# Patient Record
Sex: Female | Born: 1960 | Race: White | Hispanic: No | Marital: Married | Smoking: Current every day smoker
Health system: Southern US, Community
[De-identification: ages and names within clinical notes are randomized; demographics above are authoritative.]

## PROBLEM LIST (undated history)

## (undated) DIAGNOSIS — S069XAA Unspecified intracranial injury with loss of consciousness status unknown, initial encounter: Secondary | ICD-10-CM

## (undated) DIAGNOSIS — E785 Hyperlipidemia, unspecified: Secondary | ICD-10-CM

## (undated) DIAGNOSIS — F04 Amnestic disorder due to known physiological condition: Secondary | ICD-10-CM

## (undated) DIAGNOSIS — I2510 Atherosclerotic heart disease of native coronary artery without angina pectoris: Secondary | ICD-10-CM

## (undated) DIAGNOSIS — I10 Essential (primary) hypertension: Secondary | ICD-10-CM

## (undated) DIAGNOSIS — S069X9A Unspecified intracranial injury with loss of consciousness of unspecified duration, initial encounter: Secondary | ICD-10-CM

## (undated) DIAGNOSIS — F1096 Alcohol use, unspecified with alcohol-induced persisting amnestic disorder: Secondary | ICD-10-CM

## (undated) HISTORY — PX: OTHER SURGICAL HISTORY: SHX169

## (undated) HISTORY — DX: Hyperlipidemia, unspecified: E78.5

---

## 2014-02-01 ENCOUNTER — Encounter: Payer: Self-pay | Admitting: Emergency Medicine

## 2014-02-01 ENCOUNTER — Emergency Department (INDEPENDENT_AMBULATORY_CARE_PROVIDER_SITE_OTHER): Payer: 59

## 2014-02-01 ENCOUNTER — Emergency Department (INDEPENDENT_AMBULATORY_CARE_PROVIDER_SITE_OTHER)
Admission: EM | Admit: 2014-02-01 | Discharge: 2014-02-01 | Disposition: A | Discharge status: Home / Self Care | Payer: 59 | Source: Home / Self Care | Attending: Emergency Medicine | Admitting: Emergency Medicine

## 2014-02-01 DIAGNOSIS — S60221A Contusion of right hand, initial encounter: Secondary | ICD-10-CM

## 2014-02-01 DIAGNOSIS — M7989 Other specified soft tissue disorders: Secondary | ICD-10-CM

## 2014-02-01 DIAGNOSIS — S8002XA Contusion of left knee, initial encounter: Secondary | ICD-10-CM

## 2014-02-01 DIAGNOSIS — S42401A Unspecified fracture of lower end of right humerus, initial encounter for closed fracture: Secondary | ICD-10-CM

## 2014-02-01 DIAGNOSIS — M25569 Pain in unspecified knee: Secondary | ICD-10-CM

## 2014-02-01 DIAGNOSIS — S42409A Unspecified fracture of lower end of unspecified humerus, initial encounter for closed fracture: Secondary | ICD-10-CM

## 2014-02-01 DIAGNOSIS — S60229A Contusion of unspecified hand, initial encounter: Secondary | ICD-10-CM

## 2014-02-01 DIAGNOSIS — S52123A Displaced fracture of head of unspecified radius, initial encounter for closed fracture: Secondary | ICD-10-CM

## 2014-02-01 DIAGNOSIS — W19XXXA Unspecified fall, initial encounter: Secondary | ICD-10-CM

## 2014-02-01 DIAGNOSIS — M79609 Pain in unspecified limb: Secondary | ICD-10-CM

## 2014-02-01 DIAGNOSIS — S8000XA Contusion of unspecified knee, initial encounter: Secondary | ICD-10-CM

## 2014-02-01 NOTE — ED Notes (Signed)
Rt arm wrist to elbow injury, Left knee from fall x 4 days ago

## 2014-02-01 NOTE — ED Provider Notes (Signed)
CSN: [id number]     Arrival date & time 02/01/14  [id number] History   First MD Initiated Contact with Patient 02/01/14 1005     Chief Complaint  Patient presents with  . Fall    Patient is a 53 y.o. female presenting with fall. The history is provided by the patient.  Fall This is a new problem. Episode onset: 3 days ago. The problem has not changed since onset.Pertinent negatives include no chest pain, no abdominal pain, no headaches and no shortness of breath. The symptoms are aggravated by bending, twisting and walking. Nothing relieves the symptoms. She has tried nothing for the symptoms.   accidentally fell on pavement 3 days ago. Pain and swelling right lateral hand.-7/10 intensity. Also, pain diffusely right elbow, worsens when flexes elbow. Minimal right shoulder discomfort, but that's improving and denies any problems with range of motion or function of right shoulder. Mild to moderate right anterior knee pain. Sustained an abrasion on kneecap complains of pain underneath the abrasion. She is able to weight-bear, but weightbearing causes pain. No radiation of pain. Denies head injury or spinal complaints. No loss of consciousness or neurologic symptoms  History reviewed. No pertinent past medical history. History reviewed. No pertinent past surgical history. No family history on file. History  Substance Use Topics  . Smoking status: Current Every Day Smoker -- 40 years    Types: Cigarettes  . Smokeless tobacco: Not on file  . Alcohol Use: Yes   OB History   Grav Para Term Preterm Abortions TAB SAB Ect Mult Living                 Review of Systems  Constitutional: Negative for fever and chills.  HENT: Negative.   Respiratory: Negative.  Negative for shortness of breath.   Cardiovascular: Negative for chest pain.  Gastrointestinal: Negative.  Negative for abdominal pain.  Musculoskeletal: Negative for back pain and neck pain.  Neurological: Negative for dizziness, seizures,  syncope, numbness and headaches.      Allergies  Asa Note: she can take other nsaids without side effects  Home Medications   Current Outpatient Rx  Name  Route  Sig  Dispense  Refill  . acetaminophen (TYLENOL) 650 MG CR tablet   Oral   Take 2 tablets (1,300 mg total) by mouth every 8 (eight) hours as needed for pain.   90 tablet   3    BP 113/67  Pulse 91  Temp(Src) 98 F (36.7 C) (Oral)  Ht 5\' 2"  (1.575 m)  Wt 136 lb (61.689 kg)  BMI 24.87 kg/m2  SpO2 96% Physical Exam  Nursing note and vitals reviewed. Constitutional: She is oriented to person, place, and time. She appears well-developed and well-nourished. No distress.  HENT:  Head: Normocephalic and atraumatic.  Eyes: Conjunctivae and EOM are normal. Pupils are equal, round, and reactive to light. No scleral icterus.  Neck: Normal range of motion.  Cardiovascular: Normal rate.   Pulmonary/Chest: Effort normal.  Abdominal: She exhibits no distension.  Musculoskeletal:       Right shoulder: Normal. She exhibits normal range of motion, no tenderness, no bony tenderness, no swelling and no deformity.       Right elbow: She exhibits normal range of motion, no deformity and no laceration. Tenderness (Diffusely, exacerbated by flexion and extension) found. Radial head tenderness noted. No medial epicondyle tenderness noted.       Right wrist: Normal. She exhibits normal range of motion, no tenderness, no bony tenderness,  no swelling and no deformity.       Right upper arm: Normal.       Right forearm: Normal.       Right hand: She exhibits decreased range of motion, bony tenderness and swelling. She exhibits normal capillary refill and no laceration. Normal sensation noted. Normal strength noted.       Hands: Pain and swelling diffusely right hand, especially fifth metacarpal area.  No tenderness or deformity of distal radius or ulna  Neurological: She is alert and oriented to person, place, and time.  Skin: Skin is  warm.  Psychiatric: She has a normal mood and affect.    ED Course  Procedures  Labs Review   Imaging Review Dg Elbow Complete Right 02/01/2014   CLINICAL DATA:  Status post fall 01/29/2014.  Left elbow pain.  EXAM: RIGHT ELBOW - COMPLETE 3+ VIEW  COMPARISON:  None.  FINDINGS: There is a minimally impacted fracture of the anterior aspect of the radial head with an associated elbow joint effusion. No other acute abnormality is identified.  IMPRESSION: Minimally impacted fracture right radial head.   Electronically Signed   By: Drusilla Kanner M.D.   On: 02/01/2014 11:02   Dg Knee Complete 4 Views Left 02/01/2014   CLINICAL DATA:  Status post fall 01/29/2014.  Left knee pain.  EXAM: LEFT KNEE - COMPLETE 4+ VIEW  COMPARISON:  None.  FINDINGS: Imaged bones, joints and soft tissues appear normal.  IMPRESSION: Negative exam.   Electronically Signed   By: Drusilla Kanner M.D.   On: 02/01/2014 11:04   Dg Hand Complete Right 02/01/2014   CLINICAL DATA:  Larey Seat on 01/29/2014, pain and swelling at fifth metacarpal region  EXAM: RIGHT HAND - COMPLETE 3+ VIEW  COMPARISON:  None  FINDINGS: Overpenetrated exam.  Joint spaces preserved.  Question osseous demineralization versus over penetration.  Mild dorsal soft tissue swelling particularly at the level of the metacarpal heads.  No acute fracture, dislocation or bone destruction.  IMPRESSION: No definite acute osseous findings.   Electronically Signed   By: Ulyses Southward M.D.   On: 02/01/2014 10:59      MDM   Final diagnoses:  Closed fracture of right elbow  Contusion of right hand  Contusion of left knee    Reviewed x-rays of right elbow, left knee, and right hand. There is a minimally impacted fracture of the radial head of right elbow. No fracture or dislocation or acute bony abnormality seen in x-rays left knee and right hand.  Treatment options discussed, as well as risks, benefits, alternatives. Patient voiced understanding and agreement with the  following plans: For the right elbow fracture, ace and sling applied. Bulky ace wrap/splint applied right hand in neutral position. Left knee elastic sleeve applied. She tolerated all the above well and that decreased her pain somewhat. Although I offered, she declined any prescription pain medication. She prefers to take 2 Alleve BID PC Other symptomatic care discussed. Rest affected areas, keep elevated. We made follow-up appointment with Dr. Benjamin Stain for tomorrow. Precautions discussed. Red flags discussed. Questions invited and answered. Patient voiced understanding and agreement.    Lajean Manes, MD 02/02/14 [phone]

## 2014-02-02 ENCOUNTER — Ambulatory Visit (INDEPENDENT_AMBULATORY_CARE_PROVIDER_SITE_OTHER): Payer: 59

## 2014-02-02 ENCOUNTER — Encounter: Payer: Self-pay | Admitting: Sports Medicine

## 2014-02-02 ENCOUNTER — Ambulatory Visit (INDEPENDENT_AMBULATORY_CARE_PROVIDER_SITE_OTHER): Payer: 59 | Admitting: Sports Medicine

## 2014-02-02 VITALS — BP 120/66 | HR 105 | Ht 62.0 in | Wt 136.0 lb

## 2014-02-02 DIAGNOSIS — S62319A Displaced fracture of base of unspecified metacarpal bone, initial encounter for closed fracture: Secondary | ICD-10-CM

## 2014-02-02 DIAGNOSIS — S52123A Displaced fracture of head of unspecified radius, initial encounter for closed fracture: Secondary | ICD-10-CM

## 2014-02-02 DIAGNOSIS — M79641 Pain in right hand: Secondary | ICD-10-CM

## 2014-02-02 DIAGNOSIS — W19XXXA Unspecified fall, initial encounter: Secondary | ICD-10-CM

## 2014-02-02 DIAGNOSIS — L988 Other specified disorders of the skin and subcutaneous tissue: Secondary | ICD-10-CM

## 2014-02-02 DIAGNOSIS — M79609 Pain in unspecified limb: Secondary | ICD-10-CM

## 2014-02-02 DIAGNOSIS — IMO0002 Reserved for concepts with insufficient information to code with codable children: Secondary | ICD-10-CM | POA: Insufficient documentation

## 2014-02-02 DIAGNOSIS — S52121A Displaced fracture of head of right radius, initial encounter for closed fracture: Secondary | ICD-10-CM | POA: Insufficient documentation

## 2014-02-02 DIAGNOSIS — S62346A Nondisplaced fracture of base of fifth metacarpal bone, right hand, initial encounter for closed fracture: Secondary | ICD-10-CM | POA: Insufficient documentation

## 2014-02-02 MED ORDER — ACETAMINOPHEN ER 650 MG PO TBCR: 1300.0000 mg | EXTENDED_RELEASE_TABLET | Freq: Three times a day (TID) | ORAL | Status: DC | PRN

## 2014-02-02 NOTE — Assessment & Plan Note (Signed)
This is very mild, nondisplaced and simply impacted. Sling immobilization will be sufficient and full healing will occur approximately for 6 weeks from now.  I billed a fracture code for this visit, all subsequent visits for this complaint will be "post-op checks" in the global period.

## 2014-02-02 NOTE — Assessment & Plan Note (Signed)
There feels to be ganglion cysts in close approximation with the extensor and flexor tendons on the right fourth finger. It was swollen after her fall so we did have to do a ring removal procedure.

## 2014-02-02 NOTE — Progress Notes (Signed)
   Subjective:    I'm seeing this patient as a consultation for:  Dr. Georgina PillionMassey  CC: Right elbow pain, right hand pain  HPI: This is a very pleasant 53 year old female, yesterday she fell, impacting her right elbow and right hand. She was seen in urgent care where x-rays showed a fracture of the radial head and she was referred to me for further evaluation and definitive treatment. She was placed in a sling. Overall pain continues to improve, she does have significant pain she localizes over the base of the fifth metacarpal bone.  Past medical history, Surgical history, Family history not pertinant except as noted below, Social history, Allergies, and medications have been entered into the medical record, reviewed, and no changes needed.   Review of Systems: No headache, visual changes, nausea, vomiting, diarrhea, constipation, dizziness, abdominal pain, skin rash, fevers, chills, night sweats, weight loss, swollen lymph nodes, body aches, joint swelling, muscle aches, chest pain, shortness of breath, mood changes, visual or auditory hallucinations.   Objective:   General: Well Developed, well nourished, and in no acute distress.  Neuro/Psych: Alert and oriented x3, extra-ocular muscles intact, able to move all 4 extremities, sensation grossly intact. Skin: Warm and dry, no rashes noted.  Respiratory: Not using accessory muscles, speaking in full sentences, trachea midline.  Cardiovascular: Pulses palpable, no extremity edema. Abdomen: Does not appear distended. Right Elbow: Unremarkable to inspection. Range of motion full pronation, supination, flexion, extension. Strength is full to all of the above directions Stable to varus, valgus stress. Negative moving valgus stress test. Tender to palpation over the radial head Ulnar nerve does not sublux. Negative cubital tunnel Tinel's. Right hand: Tenderness to palpation at the base of the fifth metacarpal bone.  X-rays were reviewed, there is  a depressed fracture of the radial head, hand x-rays were personally reviewed, they do also show a lucency through the base of the fifth metacarpal bone.  CT scan of the hand was obtained confirming fracture through the base of the fifth metacarpal.  Impression and Recommendations:   This case required medical decision making of moderate complexity.

## 2014-02-02 NOTE — Assessment & Plan Note (Addendum)
Pain is localized along the base and the proximal shaft of the fifth metacarpal. X-ray does show evidence of a lucency that may represent a fracture through the radial base of the fifth metacarpal. I am going to obtain a CT scan of her hand for further delineation.  CT scan does confirm fracture, I will have her come back in for a Velcro brace.  I billed a fracture code for this visit, all subsequent visits for this complaint will be "post-op checks" in the global period.

## 2014-02-05 ENCOUNTER — Encounter: Payer: Self-pay | Admitting: Family Medicine

## 2014-02-05 ENCOUNTER — Ambulatory Visit (INDEPENDENT_AMBULATORY_CARE_PROVIDER_SITE_OTHER): Payer: 59 | Admitting: Family Medicine

## 2014-02-05 VITALS — BP 153/78 | HR 78 | Ht 62.0 in | Wt 137.0 lb

## 2014-02-05 DIAGNOSIS — Z299 Encounter for prophylactic measures, unspecified: Secondary | ICD-10-CM

## 2014-02-05 DIAGNOSIS — Z1239 Encounter for other screening for malignant neoplasm of breast: Secondary | ICD-10-CM

## 2014-02-05 DIAGNOSIS — R030 Elevated blood-pressure reading, without diagnosis of hypertension: Secondary | ICD-10-CM

## 2014-02-05 DIAGNOSIS — R232 Flushing: Secondary | ICD-10-CM

## 2014-02-05 DIAGNOSIS — IMO0001 Reserved for inherently not codable concepts without codable children: Secondary | ICD-10-CM

## 2014-02-05 DIAGNOSIS — E785 Hyperlipidemia, unspecified: Secondary | ICD-10-CM | POA: Insufficient documentation

## 2014-02-05 DIAGNOSIS — H547 Unspecified visual loss: Secondary | ICD-10-CM | POA: Insufficient documentation

## 2014-02-05 LAB — LIPID PANEL
CHOL/HDL RATIO: 4.4 ratio
Cholesterol: 268 mg/dL — ABNORMAL HIGH (ref 0–200)
HDL: 61 mg/dL (ref >39)
LDL CALC: 172 mg/dL — AB (ref 0–99)
Triglycerides: 175 mg/dL — ABNORMAL HIGH (ref <150)
VLDL: 35 mg/dL (ref 0–40)

## 2014-02-05 LAB — BASIC METABOLIC PANEL WITH GFR
BUN: 6 mg/dL (ref 6–23)
CALCIUM: 8.9 mg/dL (ref 8.4–10.5)
CO2: 26 mEq/L (ref 19–32)
Chloride: 103 mEq/L (ref 96–112)
Creat: 0.52 mg/dL (ref 0.50–1.10)
Glucose, Bld: 89 mg/dL (ref 70–99)
POTASSIUM: 4.3 meq/L (ref 3.5–5.3)
SODIUM: 137 meq/L (ref 135–145)

## 2014-02-05 LAB — TSH: TSH: 2.558 u[IU]/mL (ref 0.350–4.500)

## 2014-02-05 NOTE — Patient Instructions (Addendum)
Dr. Lajoyce Lauber General Advice Following Your Complete Physical Exam  The Benefits of Regular Exercise: Unless you suffer from an uncontrolled cardiovascular condition, studies strongly suggest that regular exercise and physical activity will add to both the quality and length of your life.  The World Health Organization recommends 150 minutes of moderate intensity aerobic activity every week.  This is best split over 3-4 days a week, and can be as simple as a brisk walk for just over 35 minutes "most days of the week".  This type of exercise has been shown to lower LDL-Cholesterol, lower average blood sugars, lower blood pressure, lower cardiovascular disease risk, improve memory, and increase one's overall sense of wellbeing.  The addition of anaerobic (or "strength training") exercises offers additional benefits including but not limited to increased metabolism, prevention of osteoporosis, and improved overall cholesterol levels.  How Can I Strive For A Low-Fat Diet?: Current guidelines recommend that 25-35 percent of your daily energy (food) intake should come from fats.  One might ask how can this be achieved without having to dissect each meal on a daily basis?  Switch to skim or 1% milk instead of whole milk.  Focus on lean meats such as ground [place], fresh fish, baked chicken, and lean cuts of beef as your source of dietary protein.  Limit saturated fat consumption to less than 10% of your daily caloric intake.  Limit trans fatty acid consumption primarily by limiting synthetic trans fats such as partially hydrogenated oils (Ex: fried fast foods).  Substitute olive or vegetable oil for solid fats where possible.  Moderation of Salt Intake: Provided you don't carry a diagnosis of congestive heart failure nor renal failure, I recommend a daily allowance of no more than 2300 mg of salt (sodium).  Keeping under this daily goal is associated with a decreased risk of cardiovascular events, creeping  above it can lead to elevated blood pressures and increases your risk of cardiovascular events.  Milligrams (mg) of salt is listed on all nutrition labels, and your daily intake can add up faster than you think.  Most canned and frozen dinners can pack in over half your daily salt allowance in one meal.    Lifestyle Health Risks: Certain lifestyle choices carry specific health risks.  As you may already know, tobacco use has been associated with increasing one's risk of cardiovascular disease, pulmonary disease, numerous cancers, among many other issues.  What you may not know is that there are medications and nicotine replacement strategies that can more than double your chances of successfully quitting.  I would be thrilled to help manage your quitting strategy if you currently use tobacco products.  When it comes to alcohol use, I've yet to find an "ideal" daily allowance.  Provided an individual does not have a medical condition that is exacerbated by alcohol consumption, general guidelines determine "safe drinking" as no more than two standard drinks for a man or no more than one standard drink for a female per day.  However, much debate still exists on whether any amount of alcohol consumption is technically "safe".  My general advice, keep alcohol consumption to a minimum for general health promotion.  If you or others believe that alcohol, tobacco, or recreational drug use is interfering with your life, I would be happy to provide confidential counseling regarding treatment options.  General "Over The Counter" Nutrition Advice: Postmenopausal women should aim for a daily calcium intake of 1200 mg, however a significant portion of this might already be  provided by diets including milk, yogurt, cheese, and other dairy products.  Vitamin D has been shown to help preserve bone density, prevent fatigue, and has even been shown to help reduce falls in the elderly.  Ensuring a daily intake of 800 Units of  Vitamin D is a good place to start to enjoy the above benefits, we can easily check your Vitamin D level to see if you'd potentially benefit from supplementation beyond 800 Units a day.  Folic Acid intake should be of particular concern to women of childbearing age.  Daily consumption of 400-800 mcg of Folic Acid is recommended to minimize the chance of spinal cord defects in a fetus should pregnancy occur.    For many adults, accidents still remain one of the most common culprits when it comes to cause of death.  Some of the simplest but most effective preventitive habits you can adopt include regular seatbelt use, proper helmet use, securing firearms, and regularly testing your smoke and carbon monoxide detectors.  Mishayla Sliwinski B. [facility] DO [facility] [address], [address] [place], [place] [address] Phone: [phone]   DASH Diet The DASH diet stands for "Dietary Approaches to Stop Hypertension." It is a healthy eating plan that has been shown to reduce high blood pressure (hypertension) in as little as 14 days, while also possibly providing other significant health benefits. These other health benefits include reducing the risk of breast cancer after menopause and reducing the risk of type 2 diabetes, heart disease, colon cancer, and stroke. Health benefits also include weight loss and slowing kidney failure in patients with chronic kidney disease.  DIET GUIDELINES  Limit salt (sodium). Your diet should contain less than 1500 mg of sodium daily.  Limit refined or processed carbohydrates. Your diet should include mostly whole grains. Desserts and added sugars should be used sparingly.  Include small amounts of heart-healthy fats. These types of fats include nuts, oils, and tub margarine. Limit saturated and trans fats. These fats have been shown to be harmful in the body. CHOOSING FOODS  The following food groups are based on a 2000 calorie diet. See your Registered Dietitian for  individual calorie needs. Grains and Grain Products (6 to 8 servings daily)  Eat More Often: Whole-wheat bread, brown rice, whole-grain or wheat pasta, quinoa, popcorn without added fat or salt (air popped).  Eat Less Often: White bread, white pasta, white rice, cornbread. Vegetables (4 to 5 servings daily)  Eat More Often: Fresh, frozen, and canned vegetables. Vegetables may be raw, steamed, roasted, or grilled with a minimal amount of fat.  Eat Less Often/Avoid: Creamed or fried vegetables. Vegetables in a cheese sauce. Fruit (4 to 5 servings daily)  Eat More Often: All fresh, canned (in natural juice), or frozen fruits. Dried fruits without added sugar. One hundred percent fruit juice ( cup [237 mL] daily).  Eat Less Often: Dried fruits with added sugar. Canned fruit in light or heavy syrup. Foot LockerLean Meats, Fish, and Poultry (2 servings or less daily. One serving is 3 to 4 oz [85-114 g]).  Eat More Often: Ninety percent or leaner ground beef, tenderloin, sirloin. Round cuts of beef, chicken breast, [place] breast. All fish. Grill, bake, or broil your meat. Nothing should be fried.  Eat Less Often/Avoid: Fatty cuts of meat, [place], or chicken leg, thigh, or wing. Fried cuts of meat or fish. Dairy (2 to 3 servings)  Eat More Often: Low-fat or fat-free milk, low-fat plain or light yogurt, reduced-fat or part-skim cheese.  Eat Less Often/Avoid: Milk (whole, 2%).Whole milk yogurt. Full-fat cheeses. Nuts, Seeds, and Legumes (4 to 5 servings per week)  Eat More Often: All without added salt.  Eat Less Often/Avoid: Salted nuts and seeds, canned beans with added salt. Fats and Sweets (limited)  Eat More Often: Vegetable oils, tub margarines without trans fats, sugar-free gelatin. Mayonnaise and salad dressings.  Eat Less Often/Avoid: Coconut oils, palm oils, butter, stick margarine, cream, half and half, cookies, candy, pie. FOR MORE INFORMATION The Dash Diet Eating Plan:  www.dashdiet.org Document Released: 11/19/2011 Document Revised: 02/22/2012 Document Reviewed: 11/19/2011 [facility] Patient Information 2014 [place], [place].

## 2014-02-05 NOTE — Progress Notes (Signed)
CC: Kristi Tanner is a 53 y.o. female is here for Establish Care   Subjective: HPI:   Pleasant 53 year old here to establish care no primary care services in 5 years  Reports a history of hyperlipidemia last cholesterol panel over 5 years ago she's unsure of specifics has never been on cholesterol lowering medications she intermittently takes fish oils and omega red.    She reports no history of elevated blood pressure reports that it has never been above 140/90 to her knowledge other than today. She does report she is in quite a bit of pain in her right hand that was worsened over the weekend but has improved since being fitted with a mobilizing brace earlier this morning.  She complains today of vision loss has been present for matter of months to years but has not been any better or worse. Is described as bilateral mild to moderate in severity. It does not change throughout the day. She describes it is uniform nothing particularly makes it worse it is improved when looking at objects within arm's length when wearing over-the-counter reading glasses. She has a strong family history of type 2 diabetes.  She also complains of what she describes as sweats and hot flashes that occurs occasionally throughout the week usually in periods of subjective anxiety this is been present ever since 2012 which was the time of her last period. She has had no vaginal bleeding since 2012. Symptoms overall are mild and improving over the past 2 years. She wants to know, former she has to deal with this  Review Of Systems Outlined In HPI  Past Medical History  Diagnosis Date  . Hyperlipidemia     Past Surgical History  Procedure Laterality Date  . (2)c-sections     Family History  Problem Relation Age of Onset  . Hyperlipidemia Mother   . Hypertension Mother   . Diabetes Father     History   Social History  . Marital Status: Married    Spouse Name: N/A    Number of Children: N/A  . Years of  Education: N/A   Occupational History  . Not on file.   Social History Main Topics  . Smoking status: Current Every Day Smoker -- 40 years    Types: Cigarettes  . Smokeless tobacco: Not on file  . Alcohol Use: Yes  . Drug Use: Not on file  . Sexual Activity: Yes    Partners: Male   Other Topics Concern  . Not on file   Social History Narrative  . No narrative on file     Objective: BP 153/78  Pulse 78  Ht 5\' 2"  (1.575 m)  Wt 137 lb (62.143 kg)  BMI 25.05 kg/m2  General: Alert and Oriented, No Acute Distress HEENT: Pupils equal, round, reactive to light. Conjunctivae clear.  Moist mucous membranes pharynx unremarkable Lungs: Clear to auscultation bilaterally, no wheezing/ronchi/rales.  Comfortable work of breathing. Good air movement. Cardiac: Regular rate and rhythm. Normal S1/S2.  No murmurs, rubs, nor gallops.  No carotid bruits Abdomen:  Soft nontender Extremities: No peripheral edema.  Strong peripheral pulses.  Mental Status: No depression, anxiety, nor agitation. Skin: Warm and dry.  Assessment & Plan: [name] was seen today for establish care.  Diagnoses and associated orders for this visit:  Hyperlipidemia - Lipid panel  Elevated blood pressure - BASIC METABOLIC PANEL WITH GFR - TSH  Preventive measure  Vision loss  Screening for breast cancer - MM DIGITAL SCREENING BILATERAL; Future  Vasomotor  flushing     hyperlipidemia: Overdue for annual fasting cholesterol panel Elevated blood pressure: Discussed the DASH diet will check renal function and thyroid function Vision loss: I strongly encouraged her to establish with a local ophthalmologist of her choice we will screen for diabetes today with fasting blood sugar Vasomotor flushing: We discussed that this may persist for up to 5 years after going through menopause there are Medications to help with this however symptoms bother her quality of life to the point where she was to start medications  yet She's overdue for routine annual mammogram     Return in about 3 months (around 05/05/2014).

## 2014-02-06 ENCOUNTER — Telehealth: Payer: Self-pay | Admitting: Family Medicine

## 2014-02-06 DIAGNOSIS — E785 Hyperlipidemia, unspecified: Secondary | ICD-10-CM

## 2014-02-06 NOTE — Telephone Encounter (Signed)
Pt.notified

## 2014-02-06 NOTE — Telephone Encounter (Signed)
Sue LushAndrea, Will you please let Mrs. Kristi Tanner know that her cholesterol was quite elevated however her estimated risk of a heart attack or stroke in the next ten years is 6.8%.  This is below the threshold immediately needing to start a cholesterol lowering medication but does warrant aggressive lifestyle interventions.  I'll send her more info on diet recommendations beyond the DASH diet by mail.  Fasting blood sugar was perfect, no sign of diabetes causing vision loss.  I'd encourage f/u in 3 months.

## 2014-02-23 ENCOUNTER — Ambulatory Visit (INDEPENDENT_AMBULATORY_CARE_PROVIDER_SITE_OTHER): Payer: 59 | Admitting: Sports Medicine

## 2014-02-23 ENCOUNTER — Encounter: Payer: Self-pay | Admitting: Sports Medicine

## 2014-02-23 VITALS — BP 140/72 | HR 94 | Ht 62.0 in | Wt 137.0 lb

## 2014-02-23 DIAGNOSIS — L988 Other specified disorders of the skin and subcutaneous tissue: Secondary | ICD-10-CM

## 2014-02-23 DIAGNOSIS — S62346A Nondisplaced fracture of base of fifth metacarpal bone, right hand, initial encounter for closed fracture: Secondary | ICD-10-CM

## 2014-02-23 DIAGNOSIS — IMO0002 Reserved for concepts with insufficient information to code with codable children: Secondary | ICD-10-CM

## 2014-02-23 DIAGNOSIS — S52121A Displaced fracture of head of right radius, initial encounter for closed fracture: Secondary | ICD-10-CM

## 2014-02-23 DIAGNOSIS — IMO0001 Reserved for inherently not codable concepts without codable children: Secondary | ICD-10-CM

## 2014-02-23 NOTE — Progress Notes (Signed)
  Subjective: 3 weeks postfracture the right radial head and right fifth metacarpal, doing extremely well. Radial head is nontender.   Objective: General: Well-developed, well-nourished, and in no acute distress. Right Elbow: Unremarkable to inspection. Range of motion full pronation, supination, flexion, extension. Strength is full to all of the above directions Stable to varus, valgus stress. Negative moving valgus stress test. No discrete areas of tenderness to palpation. Ulnar nerve does not sublux. Negative cubital tunnel Tinel's. Right hand: Tender to palpation over the fracture at the base of the fifth metacarpal.  Assessment/plan:

## 2014-02-23 NOTE — Assessment & Plan Note (Signed)
Essentially healed clinically, discontinue sling. Return as needed for this.

## 2014-02-23 NOTE — Assessment & Plan Note (Signed)
Still mildly tender to palpation over the fracture site, continue wrist brace, return in 3 weeks.

## 2014-02-23 NOTE — Assessment & Plan Note (Signed)
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

## 2014-03-19 ENCOUNTER — Encounter: Payer: Self-pay | Admitting: Sports Medicine

## 2014-03-19 ENCOUNTER — Ambulatory Visit (INDEPENDENT_AMBULATORY_CARE_PROVIDER_SITE_OTHER): Payer: 59 | Admitting: Sports Medicine

## 2014-03-19 VITALS — BP 143/77 | HR 97 | Ht 62.0 in | Wt 138.0 lb

## 2014-03-19 DIAGNOSIS — S52121A Displaced fracture of head of right radius, initial encounter for closed fracture: Secondary | ICD-10-CM

## 2014-03-19 DIAGNOSIS — M19049 Primary osteoarthritis, unspecified hand: Secondary | ICD-10-CM

## 2014-03-19 DIAGNOSIS — M6740 Ganglion, unspecified site: Secondary | ICD-10-CM

## 2014-03-19 DIAGNOSIS — IMO0002 Reserved for concepts with insufficient information to code with codable children: Secondary | ICD-10-CM

## 2014-03-19 DIAGNOSIS — S62346A Nondisplaced fracture of base of fifth metacarpal bone, right hand, initial encounter for closed fracture: Secondary | ICD-10-CM

## 2014-03-19 MED ORDER — MELOXICAM 15 MG PO TABS: ORAL_TABLET | ORAL | Status: DC

## 2014-03-19 NOTE — Assessment & Plan Note (Signed)
Pain-free now at 6 weeks.

## 2014-03-19 NOTE — Progress Notes (Signed)
  Subjective:    CC: Followup  HPI: Radial head fracture: Resolved.  Base of the fifth metacarpal fracture, right : resolved.  Ganglion cyst: We did discuss injection and aspiration, she has 2 ganglion cysts, one on the dorsum and one on the volar aspect of the left third finger.  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  Past medical history, Surgical history, Family history not pertinant except as noted below, Social history, Allergies, and medications have been entered into the medical record, reviewed, and no changes needed.   Review of Systems: No fevers, chills, night sweats, weight loss, chest pain, or shortness of breath.   Objective:    General: Well Developed, well nourished, and in no acute distress.  Neuro: Alert and oriented x3, extra-ocular muscles intact, sensation grossly intact.  HEENT: Normocephalic, atraumatic, pupils equal round reactive to light, neck supple, no masses, no lymphadenopathy, thyroid nonpalpable.  Skin: Warm and dry, no rashes. Cardiac: Regular rate and rhythm, no murmurs rubs or gallops, no lower extremity edema.  Respiratory: Clear to auscultation bilaterally. Not using accessory muscles, speaking in full sentences.  Procedure: Real-time Ultrasound Guided Injection of left third dorsal ganglion cyst Device: GE Logiq E  Verbal informed consent obtained.  Time-out conducted.  Noted no overlying erythema, induration, or other signs of local infection.  Skin prepped in a sterile fashion.  Local anesthesia: Topical Ethyl chloride.  With sterile technique and under real time ultrasound guidance:  25-gauge needle advanced into cyst, 0.5 cc Kenalog 40, 0.5 cc lidocaine injected easily. Completed without difficulty  Pain immediately resolved suggesting accurate placement of the medication.  Advised to call if fevers/chills, erythema, induration, drainage, or persistent bleeding.  Images  permanently stored and available for review in the ultrasound unit.  Impression: Technically successful ultrasound guided injection.  Procedure: Real-time Ultrasound Guided Injection of left third volar ganglion cyst Device: GE Logiq E  Verbal informed consent obtained.  Time-out conducted.  Noted no overlying erythema, induration, or other signs of local infection.  Skin prepped in a sterile fashion.  Local anesthesia: Topical Ethyl chloride.  With sterile technique and under real time ultrasound guidance:  25-gauge needle advanced into cyst, 0.5 cc Kenalog 40, 0.5 cc lidocaine injected easily. Completed without difficulty  Pain immediately resolved suggesting accurate placement of the medication.  Advised to call if fevers/chills, erythema, induration, drainage, or persistent bleeding.  Images permanently stored and available for review in the ultrasound unit.  Impression: Technically successful ultrasound guided injection.  Procedure: Real-time Ultrasound Guided Injection of right fourth distal interphalangeal joint Device: GE Logiq E  Verbal informed consent obtained.  Time-out conducted.  Noted no overlying erythema, induration, or other signs of local infection.  Skin prepped in a sterile fashion.  Local anesthesia: Topical Ethyl chloride.  With sterile technique and under real time ultrasound guidance:  25-gauge needle advanced and joint, 0.5 cc Kenalog 40, 0.5 cc lidocaine injected easily.  Completed without difficulty  Pain immediately resolved suggesting accurate placement of the medication.  Advised to call if fevers/chills, erythema, induration, drainage, or persistent bleeding.  Images permanently stored and available for review in the ultrasound unit.  Impression: Technically successful ultrasound guided injection.  Impression and Recommendations:

## 2014-03-19 NOTE — Assessment & Plan Note (Addendum)
With painful Heberden and Bouchard nodes. Injection into right fourth distal interphalangeal joint as above. There is also an element of mallet finger, Staxx splint placed.

## 2014-03-19 NOTE — Assessment & Plan Note (Addendum)
Injection into 2 left-sided ganglion cysts as above.

## 2014-03-19 NOTE — Assessment & Plan Note (Signed)
6 weeks out, clinically resolved.

## 2014-04-18 ENCOUNTER — Ambulatory Visit (INDEPENDENT_AMBULATORY_CARE_PROVIDER_SITE_OTHER): Payer: 59 | Admitting: Family Medicine

## 2014-04-18 ENCOUNTER — Encounter: Payer: Self-pay | Admitting: Family Medicine

## 2014-04-18 ENCOUNTER — Ambulatory Visit (INDEPENDENT_AMBULATORY_CARE_PROVIDER_SITE_OTHER): Payer: 59 | Admitting: Sports Medicine

## 2014-04-18 ENCOUNTER — Encounter: Payer: Self-pay | Admitting: Sports Medicine

## 2014-04-18 VITALS — BP 146/72 | HR 101 | Ht 62.0 in | Wt 135.0 lb

## 2014-04-18 DIAGNOSIS — L988 Other specified disorders of the skin and subcutaneous tissue: Secondary | ICD-10-CM

## 2014-04-18 DIAGNOSIS — R030 Elevated blood-pressure reading, without diagnosis of hypertension: Secondary | ICD-10-CM

## 2014-04-18 DIAGNOSIS — E785 Hyperlipidemia, unspecified: Secondary | ICD-10-CM

## 2014-04-18 DIAGNOSIS — IMO0001 Reserved for inherently not codable concepts without codable children: Secondary | ICD-10-CM

## 2014-04-18 DIAGNOSIS — I10 Essential (primary) hypertension: Secondary | ICD-10-CM

## 2014-04-18 DIAGNOSIS — M19049 Primary osteoarthritis, unspecified hand: Secondary | ICD-10-CM

## 2014-04-18 DIAGNOSIS — IMO0002 Reserved for concepts with insufficient information to code with codable children: Secondary | ICD-10-CM

## 2014-04-18 LAB — LIPID PANEL
CHOLESTEROL: 290 mg/dL — AB (ref 0–200)
HDL: 75 mg/dL (ref >39)
LDL Cholesterol: 186 mg/dL — ABNORMAL HIGH (ref 0–99)
TRIGLYCERIDES: 143 mg/dL (ref <150)
Total CHOL/HDL Ratio: 3.9 Ratio
VLDL: 29 mg/dL (ref 0–40)

## 2014-04-18 MED ORDER — HYDROCHLOROTHIAZIDE 25 MG PO TABS: ORAL_TABLET | ORAL | Status: DC

## 2014-04-18 NOTE — Progress Notes (Signed)
CC: Kristi Tanner is a 53 y.o. female is here for Hypertension and Hyperlipidemia   Subjective: HPI:  Followup elevated blood pressure: She has been trying to stick to the DASH diet, reducing sodium intake but admits to difficulty achieving less than 1500 mg daily. No outside blood pressures to report. Exercising most days of the week on a treadmill up to 10 minutes but no longer.  Denies chest pain, shortness of breath, orthopnea, peripheral edema, irregular heartbeat  Followup hyperlipidemia: Trying to stick to a low-fat diet.  Denies limb claudication, motor or sensory disturbances, exertional chest pain.   Review Of Systems Outlined In HPI  Past Medical History  Diagnosis Date  . Hyperlipidemia     Past Surgical History  Procedure Laterality Date  . (2)c-sections     Family History  Problem Relation Age of Onset  . Hyperlipidemia Mother   . Hypertension Mother   . Diabetes Father     History   Social History  . Marital Status: Married    Spouse Name: N/A    Number of Children: N/A  . Years of Education: N/A   Occupational History  . Not on file.   Social History Main Topics  . Smoking status: Current Every Day Smoker -- 40 years    Types: Cigarettes  . Smokeless tobacco: Not on file  . Alcohol Use: Yes  . Drug Use: Not on file  . Sexual Activity: Yes    Partners: Male   Other Topics Concern  . Not on file   Social History Narrative  . No narrative on file     Objective: BP 146/72  Pulse 101  Ht 5\' 2"  (1.575 m)  Wt 135 lb (61.236 kg)  BMI 24.69 kg/m2  General: Alert and Oriented, No Acute Distress HEENT: Pupils equal, round, reactive to light. Conjunctivae clear.   Moist mucous membranes pharynx unremarkable  Lungs: Clear to auscultation bilaterally, no wheezing/ronchi/rales.  Comfortable work of breathing. Good air movement. Cardiac: Regular rate and rhythm. Normal S1/S2.  No murmurs, rubs, nor gallops.   Extremities: No peripheral edema.  Strong  peripheral pulses.  Mental Status: No depression, anxiety, nor agitation. Skin: Warm and dry.  Assessment & Plan: Kristi Tanner was seen today for hypertension and hyperlipidemia.  Diagnoses and associated orders for this visit:  Hyperlipidemia - Lipid panel  Elevated blood pressure - hydrochlorothiazide (HYDRODIURIL) 25 MG tablet; One tablet by mouth every morning for blood pressure control.    Hyperlipidemia: Recheck and cholesterol panel today Elevated blood pressure: Given a formal diagnosis of essential hypertension today therefore start hydrochlorothiazide continue efforts to keep sodium down, encouraged to engage in moderate physical activity up to 30 minutes most days of the week  Return in about 3 months (around 07/19/2014).

## 2014-04-18 NOTE — Progress Notes (Signed)
  Subjective:    CC: Followup  HPI: Left hand ganglion cysts : both are nearly resolved, much softer, and no longer painful after injection.  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 Has now been in extension of mobilization for 4 weeks. Overall much better with less extension lag.  Past medical history, Surgical history, Family history not pertinant except as noted below, Social history, Allergies, and medications have been entered into the medical record, reviewed, and no changes needed.   Review of Systems: No fevers, chills, night sweats, weight loss, chest pain, or shortness of breath.   Objective:    General: Well Developed, well nourished, and in no acute distress.  Neuro: Alert and oriented x3, extra-ocular muscles intact, sensation grossly intact.  HEENT: Normocephalic, atraumatic, pupils equal round reactive to light, neck supple, no masses, no lymphadenopathy, thyroid nonpalpable.  Skin: Warm and dry, no rashes. Cardiac: Regular rate and rhythm, no murmurs rubs or gallops, no lower extremity edema.  Respiratory: Clear to auscultation bilaterally. Not using accessory muscles, speaking in full sentences. Left hand: Ganglion cysts are very soft, the volar ganglion is still present and palpable, dorsal ganglion is all but resolved. Right hand: Approximately 2-3 of extension lag still present with good strength to extension.  Impression and Recommendations:

## 2014-04-18 NOTE — Assessment & Plan Note (Signed)
Right fourth distal interphalangeal joint injection helped some of her pain. Continue extension splinting for additional month, she still has a mild extension lag, maybe 2-3. Return in one month.

## 2014-04-18 NOTE — Assessment & Plan Note (Signed)
Response to ganglion cyst injections, the dorsal ganglion cyst has all but disappeared. The volar ganglion cyst is significantly smaller and much softer, at this point they do not bother her and she does not desire further interventional treatment. At the next visit, we certainly could consider repeat interventional treatment to the volar ganglion.

## 2014-04-19 ENCOUNTER — Telehealth: Payer: Self-pay | Admitting: Family Medicine

## 2014-04-19 DIAGNOSIS — E785 Hyperlipidemia, unspecified: Secondary | ICD-10-CM

## 2014-04-19 MED ORDER — ATORVASTATIN CALCIUM 20 MG PO TABS: 20.0000 mg | ORAL_TABLET | Freq: Every day | ORAL | Status: DC

## 2014-04-19 NOTE — Telephone Encounter (Signed)
Called and no answer and no vm  

## 2014-04-19 NOTE — Telephone Encounter (Signed)
Kristi Tanner, Will you please let patient know that her LDL cholesterol is still significantly elevated and actually increased compared to three months ago.  I'd recommend she start a cholesterol lowering medication known as atorvastatin which I've sen tto her [facility], we can recheck its effectiveness in about three months.

## 2014-04-20 NOTE — Telephone Encounter (Signed)
Pt notified and labs and vitals mailed to pt per her request

## 2014-05-16 ENCOUNTER — Ambulatory Visit (INDEPENDENT_AMBULATORY_CARE_PROVIDER_SITE_OTHER): Payer: 59 | Admitting: Sports Medicine

## 2014-05-16 ENCOUNTER — Encounter: Payer: Self-pay | Admitting: Sports Medicine

## 2014-05-16 VITALS — BP 123/73 | HR 96 | Ht 62.0 in | Wt 136.0 lb

## 2014-05-16 DIAGNOSIS — M19049 Primary osteoarthritis, unspecified hand: Secondary | ICD-10-CM

## 2014-05-16 DIAGNOSIS — L988 Other specified disorders of the skin and subcutaneous tissue: Secondary | ICD-10-CM

## 2014-05-16 DIAGNOSIS — IMO0002 Reserved for concepts with insufficient information to code with codable children: Secondary | ICD-10-CM

## 2014-05-16 NOTE — Assessment & Plan Note (Signed)
Doing well after fourth DIP right sided injection. She does have some Heberden and Bouchard nodes as expected. Return as needed.

## 2014-05-16 NOTE — Progress Notes (Signed)
  Subjective:    CC: Follow up  HPI: Right fourth distal interphalangeal joint osteoarthritis : improved and nearly resolved after injection, and taking meloxicam.  Ganglion cysts: smaller, asymptomatic, and much softer after injection.  Past medical history, Surgical history, Family history not pertinant except as noted below, Social history, Allergies, and medications have been entered into the medical record, reviewed, and no changes needed.   Review of Systems: No fevers, chills, night sweats, weight loss, chest pain, or shortness of breath.   Objective:    General: Well Developed, well nourished, and in no acute distress.  Neuro: Alert and oriented x3, extra-ocular muscles intact, sensation grossly intact.  HEENT: Normocephalic, atraumatic, pupils equal round reactive to light, neck supple, no masses, no lymphadenopathy, thyroid nonpalpable.  Skin: Warm and dry, no rashes. Cardiac: Regular rate and rhythm, no murmurs rubs or gallops, no lower extremity edema.  Respiratory: Clear to auscultation bilaterally. Not using accessory muscles, speaking in full sentences.  Impression and Recommendations:

## 2014-05-16 NOTE — Assessment & Plan Note (Signed)
Improved significant after injections. She's happy with results so far.

## 2014-06-09 ENCOUNTER — Encounter: Payer: Self-pay | Admitting: Emergency Medicine

## 2014-06-09 ENCOUNTER — Telehealth: Payer: Self-pay | Admitting: Emergency Medicine

## 2014-06-09 ENCOUNTER — Emergency Department (INDEPENDENT_AMBULATORY_CARE_PROVIDER_SITE_OTHER): Payer: 59

## 2014-06-09 ENCOUNTER — Emergency Department
Admission: EM | Admit: 2014-06-09 | Discharge: 2014-06-09 | Disposition: A | Discharge status: Home / Self Care | Payer: 59 | Source: Home / Self Care

## 2014-06-09 DIAGNOSIS — R0989 Other specified symptoms and signs involving the circulatory and respiratory systems: Secondary | ICD-10-CM

## 2014-06-09 DIAGNOSIS — R062 Wheezing: Secondary | ICD-10-CM

## 2014-06-09 DIAGNOSIS — R05 Cough: Secondary | ICD-10-CM

## 2014-06-09 DIAGNOSIS — R059 Cough, unspecified: Secondary | ICD-10-CM

## 2014-06-09 DIAGNOSIS — F17200 Nicotine dependence, unspecified, uncomplicated: Secondary | ICD-10-CM

## 2014-06-09 DIAGNOSIS — J40 Bronchitis, not specified as acute or chronic: Secondary | ICD-10-CM

## 2014-06-09 DIAGNOSIS — J189 Pneumonia, unspecified organism: Secondary | ICD-10-CM

## 2014-06-09 DIAGNOSIS — J069 Acute upper respiratory infection, unspecified: Secondary | ICD-10-CM

## 2014-06-09 DIAGNOSIS — R0609 Other forms of dyspnea: Secondary | ICD-10-CM

## 2014-06-09 HISTORY — DX: Essential (primary) hypertension: I10

## 2014-06-09 MED ORDER — METHYLPREDNISOLONE SODIUM SUCC 125 MG IJ SOLR
125.0000 mg | Freq: Once | INTRAMUSCULAR | Status: AC
  Administered 2014-06-09: 125 mg via INTRAMUSCULAR

## 2014-06-09 MED ORDER — DOXYCYCLINE HYCLATE 100 MG PO CAPS: 100.0000 mg | ORAL_CAPSULE | Freq: Two times a day (BID) | ORAL | Status: AC

## 2014-06-09 MED ORDER — BENZONATATE 100 MG PO CAPS: 100.0000 mg | ORAL_CAPSULE | Freq: Three times a day (TID) | ORAL | Status: DC | PRN

## 2014-06-09 MED ORDER — PREDNISONE 50 MG PO TABS: ORAL_TABLET | ORAL | Status: DC

## 2014-06-09 NOTE — ED Notes (Signed)
Cough began on Tuesday worsening,  non productive.  Yesterday when eating solid food, some became stuck in throat and pt. States she couldn't breathed.  Liqs go down fine but has not tried solid food since.

## 2014-06-09 NOTE — ED Provider Notes (Signed)
CSN: [id number]     Arrival date & time 06/09/14  1641 History   None    Chief Complaint  Patient presents with  . Cough    HPI  URI Symptoms Onset: 4-5 days  Description: cough, dyspnea, wheezing Modifying factors:  25+ pack year smoking history   Symptoms Nasal discharge: minimal  Fever: no Sore throat: no Cough: yes Wheezing: yes Ear pain: no GI symptoms: no Sick contacts: no  Red Flags  Stiff neck: no Dyspnea: no Rash: no Swallowing difficulty: no  Sinusitis Risk Factors Headache/face pain: no Double sickening: no tooth pain: no  Allergy Risk Factors Sneezing: no Itchy scratchy throat: no Seasonal symptoms: no  Flu Risk Factors Headache: no muscle aches: no severe fatigue: no   Past Medical History  Diagnosis Date  . Hyperlipidemia   . Hypertension    Past Surgical History  Procedure Laterality Date  . (2)c-sections     Family History  Problem Relation Age of Onset  . Hyperlipidemia Mother   . Hypertension Mother   . Diabetes Father    History  Substance Use Topics  . Smoking status: Current Every Day Smoker -- 0.50 packs/day for 40 years    Types: Cigarettes  . Smokeless tobacco: Never Used  . Alcohol Use: Yes     Comment: 1 glass of wine   OB History   Grav Para Term Preterm Abortions TAB SAB Ect Mult Living                 Review of Systems  All other systems reviewed and are negative.   Allergies  Asa  Home Medications   Prior to Admission medications   Medication Sig Start Date End Date Taking? Authorizing Provider  acetaminophen (TYLENOL) 650 MG CR tablet Take 2 tablets (1,300 mg total) by mouth every 8 (eight) hours as needed for pain. 02/02/14   Monica Bectonhomas J Thekkekandam, MD  atorvastatin (LIPITOR) 20 MG tablet Take 1 tablet (20 mg total) by mouth daily. 04/19/14   Laren BoomSean Hommel, DO  Biotin 5000 MCG CAPS Take 5,000 mcg by mouth 2 (two) times daily.    Historical Provider, MD  Coenzyme Q10 (CO Q-10) 50 MG CAPS Take by mouth.     Historical Provider, MD  hydrochlorothiazide (HYDRODIURIL) 25 MG tablet One tablet by mouth every morning for blood pressure control. 04/18/14 04/18/15  Laren BoomSean Hommel, DO  Krill Oil 300 MG CAPS Take 300 mg by mouth.    Historical Provider, MD  meloxicam (MOBIC) 15 MG tablet One tab PO qAM with breakfast for 2 weeks, then daily prn pain. 03/19/14   Monica Bectonhomas J Thekkekandam, MD  Multiple Vitamin (MULTIVITAMIN) LIQD Take 5 mLs by mouth daily.    Historical Provider, MD  Multiple Vitamins-Calcium (ONE-A-DAY WOMENS FORMULA PO) Take by mouth.    Historical Provider, MD  ranitidine (ZANTAC) 150 MG tablet Take 150 mg by mouth 2 (two) times daily.    Historical Provider, MD   BP 104/70  Pulse 94  Temp(Src) 98.2 F (36.8 C) (Oral)  Ht 5\' 2"  (1.575 m)  Wt 135 lb (61.236 kg)  BMI 24.69 kg/m2  SpO2 97% Physical Exam  Constitutional: She appears well-developed and well-nourished.  HENT:  Head: Normocephalic and atraumatic.  Right Ear: External ear normal.  Left Ear: External ear normal.  +nasal erythema, rhinorrhea bilaterally, + post oropharyngeal erythema    Eyes: Conjunctivae are normal. Pupils are equal, round, and reactive to light.  Neck: Normal range of motion. Neck supple.  Cardiovascular: Normal rate and regular rhythm.   Pulmonary/Chest: Effort normal. She has wheezes.  Abdominal: Soft.  Musculoskeletal: Normal range of motion.  Neurological: She is alert.  Skin: Skin is warm.    ED Course  Procedures (including critical care time) Labs Review Labs Reviewed - No data to display  Imaging Review Dg Chest 2 View  06/09/2014   CLINICAL DATA:  Cough for 5 days.  Smoking history.  EXAM: CHEST  2 VIEW  COMPARISON:  None.  FINDINGS: The cardiomediastinal silhouette is within normal limits. The lungs are well inflated. Mildly increased opacity is questioned in the right infrahilar lung base on the PA image, although no definite abnormality is identified on the lateral radiograph. The left lung is  clear. No pleural effusion or pneumothorax is identified. No acute osseous abnormality is identified.  IMPRESSION: Possible medial right basilar lung infectious infiltrate.   Electronically Signed   By: Sebastian AcheAllen  Grady   On: 06/09/2014 17:43     MDM   1. URI (upper respiratory infection)   2. Bronchitis   3. Wheezing    Noted RLL PNA  with ? Undiagnosed reactive airway disease.  Solumedrol 125mg  IM x1 Prednisone and doxycycline Discussed infectious and resp red flags  Follow up as needed.    The patient and/or caregiver has been counseled thoroughly with regard to treatment plan and/or medications prescribed including dosage, schedule, interactions, rationale for use, and possible side effects and they verbalize understanding. Diagnoses and expected course of recovery discussed and will return if not improved as expected or if the condition worsens. Patient and/or caregiver verbalized understanding.         Doree AlbeeSteven Lilibeth Opie, MD 06/09/14 [phone]

## 2014-06-11 ENCOUNTER — Telehealth: Payer: Self-pay

## 2014-06-11 NOTE — ED Notes (Signed)
I called and spoke with patient and she is not doing better. I advised to call back if anything changes or if she has questions or concerns. I advised patient to schedule a follow up with Dr Ivan AnchorsHommel, her PCP, to confirm resolved.

## 2014-06-19 ENCOUNTER — Ambulatory Visit (INDEPENDENT_AMBULATORY_CARE_PROVIDER_SITE_OTHER): Payer: 59 | Admitting: Family Medicine

## 2014-06-19 ENCOUNTER — Encounter: Payer: Self-pay | Admitting: Family Medicine

## 2014-06-19 VITALS — BP 127/67 | HR 99 | Temp 98.1°F | Wt 134.0 lb

## 2014-06-19 DIAGNOSIS — J189 Pneumonia, unspecified organism: Secondary | ICD-10-CM

## 2014-06-19 DIAGNOSIS — R635 Abnormal weight gain: Secondary | ICD-10-CM

## 2014-06-19 DIAGNOSIS — E785 Hyperlipidemia, unspecified: Secondary | ICD-10-CM

## 2014-06-19 LAB — LIPID PANEL
CHOL/HDL RATIO: 3.8 ratio
Cholesterol: 218 mg/dL — ABNORMAL HIGH (ref 0–200)
HDL: 57 mg/dL (ref >39)
LDL CALC: 130 mg/dL — AB (ref 0–99)
TRIGLYCERIDES: 155 mg/dL — AB (ref <150)
VLDL: 31 mg/dL (ref 0–40)

## 2014-06-19 MED ORDER — ALBUTEROL SULFATE HFA 108 (90 BASE) MCG/ACT IN AERS: INHALATION_SPRAY | RESPIRATORY_TRACT | Status: AC

## 2014-06-19 MED ORDER — TOPIRAMATE 50 MG PO TABS: 50.0000 mg | ORAL_TABLET | Freq: Every day | ORAL | Status: DC

## 2014-06-19 NOTE — Progress Notes (Signed)
CC: Kristi Tanner is a 53 y.o. female is here for f/u UC   Subjective: HPI:  Followup community acquired pneumonia: She has finished a seven-day course of doxycycline and five-day course of prednisone. She states the shortness of breath has resolved, fatigue is improving, wheezing has improved however she still has wheezing in the morning and at nighttime.  She reports she started to get her strength back and appetite has fully returned. She denies any cough or shortness of breath or chest pain.    Followup hyperlipidemia: Continues to take atorvastatin on a daily basis without known side effects, myalgias, right upper quadrant pain. She is also cutting out fat in her diet. No formal exercise routine tries to walk as much as she can during her free time.  Expresses frustration with difficulty losing weight.  She is hopeful that she can stop taking some of her medication such as hydrochlorothiazide or atorvastatin if she can lose some weight. Has already been focusing on portion control.   Review Of Systems Outlined In HPI  Past Medical History  Diagnosis Date  . Hyperlipidemia   . Hypertension     Past Surgical History  Procedure Laterality Date  . (2)c-sections     Family History  Problem Relation Age of Onset  . Hyperlipidemia Mother   . Hypertension Mother   . Diabetes Father     History   Social History  . Marital Status: Married    Spouse Name: N/A    Number of Children: N/A  . Years of Education: N/A   Occupational History  . Not on file.   Social History Main Topics  . Smoking status: Current Every Day Smoker -- 0.50 packs/day for 40 years    Types: Cigarettes  . Smokeless tobacco: Never Used  . Alcohol Use: Yes     Comment: 1 glass of wine  . Drug Use: No  . Sexual Activity: Yes    Partners: Male   Other Topics Concern  . Not on file   Social History Narrative  . No narrative on file     Objective: BP 127/67  Pulse 99  Temp(Src) 98.1 F (36.7 C)  (Oral)  Wt 134 lb (60.782 kg)  General: Alert and Oriented, No Acute Distress HEENT: Pupils equal, round, reactive to light. Conjunctivae clear.  External ears unremarkable, canals clear with intact TMs with appropriate landmarks.  Middle ear appears open without effusion. Pink inferior turbinates.  Moist mucous membranes, pharynx without inflammation nor lesions.  Neck supple without palpable lymphadenopathy nor abnormal masses. Lungs: Trace end expiratory wheezing in the left lung fields, no rhonchi or rails. No signs of consolidation. Comfortable work of breathing Cardiac: Regular rate and rhythm. Normal S1/S2.  No murmurs, rubs, nor gallops.   Abdomen: Mild truncal obesity Extremities: No peripheral edema.  Strong peripheral pulses.  Mental Status: No depression, anxiety, nor agitation. Skin: Warm and dry.  Assessment & Plan: [name] was seen today for f/u uc.  Diagnoses and associated orders for this visit:  CAP (community acquired pneumonia) - albuterol (PROVENTIL HFA;VENTOLIN HFA) 108 (90 BASE) MCG/ACT inhaler; Inhale two puffs every 4-6 hours only as needed for shortness of breath or wheezing.  Hyperlipidemia - Lipid panel  Weight gain - topiramate (TOPAMAX) 50 MG tablet; Take 1 tablet (50 mg total) by mouth daily.    Community acquired pneumonia: Resolved however lingering wheezing therefore start albuterol samples on as-needed basis. Congratulated her on quitting smoking  Hyperlipidemia: Due for cholesterol check after 2  months of a statin Weight gain: Discussed a technically she's not obese based on her BMI, since she hopes to get rid of some of her medications with weight loss we'll see if topiramate provides any benefit.  Return in about 4 weeks (around 07/17/2014).

## 2014-07-15 ENCOUNTER — Other Ambulatory Visit: Payer: Self-pay | Admitting: Family Medicine

## 2014-07-17 ENCOUNTER — Other Ambulatory Visit: Payer: Self-pay | Admitting: Family Medicine

## 2014-07-19 ENCOUNTER — Ambulatory Visit: Payer: 59 | Admitting: Family Medicine

## 2014-07-20 ENCOUNTER — Encounter: Payer: Self-pay | Admitting: Family Medicine

## 2014-07-20 ENCOUNTER — Ambulatory Visit (INDEPENDENT_AMBULATORY_CARE_PROVIDER_SITE_OTHER): Payer: 59 | Admitting: Family Medicine

## 2014-07-20 VITALS — BP 125/70 | HR 111 | Ht 62.0 in | Wt 133.0 lb

## 2014-07-20 DIAGNOSIS — IMO0001 Reserved for inherently not codable concepts without codable children: Secondary | ICD-10-CM

## 2014-07-20 DIAGNOSIS — R030 Elevated blood-pressure reading, without diagnosis of hypertension: Secondary | ICD-10-CM

## 2014-07-20 DIAGNOSIS — E785 Hyperlipidemia, unspecified: Secondary | ICD-10-CM

## 2014-07-20 DIAGNOSIS — R635 Abnormal weight gain: Secondary | ICD-10-CM

## 2014-07-20 MED ORDER — TOPIRAMATE 100 MG PO TABS: 100.0000 mg | ORAL_TABLET | Freq: Every day | ORAL | Status: DC

## 2014-07-20 NOTE — Patient Instructions (Signed)
Stop atorvastatin for two weeks, if muscle discomfort does not resolve then restart atorvastatin and stop hydrochlorothiazide for two weeks.  Let me know if stopping either of them improves your muscle aches.

## 2014-07-20 NOTE — Progress Notes (Signed)
CC: Kristi Tanner is a 53 y.o. female is here for Follow-up   Subjective: HPI:  Followup hypertension: Continues to take hydrochlorothiazide on a daily basis no outside blood pressures to report. Since taking this medication she is noticed muscle discomfort in her upper and lower extremities that she further describes as almost like a mild continuous spasm. Nothing seems to make better or worse but it began soon after she started taking hydrochlorothiazide. Denies chest pain shortness of breath orthopnea nor peripheral edema  Follow hyperlipidemia: Continues to take Lipitor a daily basis without limb claudication or right upper quadrant pain. She notes that she begin taking hydrochlorothiazide and Lipitor at the same time and soon after beginning these medications she began to experience the symptoms described above.  Followup weight gain: Since I saw her last she's not had any unintentional weight gain but expresses frustration with lack of weight loss. She would like to get down to 115 pounds. No formal exercise routine but she does watch what she eats. Drinks 3 glasses of wine a night.  Review Of Systems Outlined In HPI  Past Medical History  Diagnosis Date  . Hyperlipidemia   . Hypertension     Past Surgical History  Procedure Laterality Date  . (2)c-sections     Family History  Problem Relation Age of Onset  . Hyperlipidemia Mother   . Hypertension Mother   . Diabetes Father     History   Social History  . Marital Status: Married    Spouse Name: N/A    Number of Children: N/A  . Years of Education: N/A   Occupational History  . Not on file.   Social History Main Topics  . Smoking status: Current Every Day Smoker -- 0.50 packs/day for 40 years    Types: Cigarettes  . Smokeless tobacco: Never Used  . Alcohol Use: Yes     Comment: 1 glass of wine  . Drug Use: No  . Sexual Activity: Yes    Partners: Male   Other Topics Concern  . Not on file   Social History  Narrative  . No narrative on file     Objective: BP 125/70  Pulse 111  Ht 5\' 2"  (1.575 m)  Wt 133 lb (60.328 kg)  BMI 24.32 kg/m2  General: Alert and Oriented, No Acute Distress HEENT: Pupils equal, round, reactive to light. Conjunctivae clear.  Moist mucous membranes pharynx unremarkable Lungs: Clear to auscultation bilaterally, no wheezing/ronchi/rales.  Comfortable work of breathing. Good air movement. Cardiac: Regular rate and rhythm. Normal S1/S2.  No murmurs, rubs, nor gallops.   Extremities: No peripheral edema.  Strong peripheral pulses.  Mental Status: No depression, anxiety, nor agitation. Skin: Warm and dry.  Assessment & Plan: [name] was seen today for follow-up.  Diagnoses and associated orders for this visit:  Elevated blood pressure  Hyperlipidemia  Weight gain - topiramate (TOPAMAX) 100 MG tablet; Take 1 tablet (100 mg total) by mouth daily.    Elevated blood pressure: Controlled however possible side effect of hydrochlorothiazide therefore we'll stop this for 2 weeks if Lipitor cessation does not relieve her muscle aches. If hydrochlorothiazide is the culprit next up would be to switch to lisinopril Hyperlipidemia: Possible side effect of Lipitor therefore stopped for 2 weeks and continue cessation of muscle aches resolved, if no resolution after 2 weeks restart  weight gain: Advised that cutting back on wine consumption with help with weight loss, she's not a candidate for phentermine for other diet medications due  to her BMI being normal, will increase though   Return in about 2 months (around 09/19/2014).

## 2014-08-01 ENCOUNTER — Other Ambulatory Visit: Payer: Self-pay | Admitting: Sports Medicine

## 2014-10-04 IMAGING — CR DG KNEE COMPLETE 4+V*L*
4 series · 4 of 4 positions shown · non-contrast
Comparison: None.

CLINICAL DATA: Status post fall 01/29/2014.  Left knee pain.

EXAM:
LEFT KNEE - COMPLETE 4+ VIEW

[view not recorded (1 of 4)]
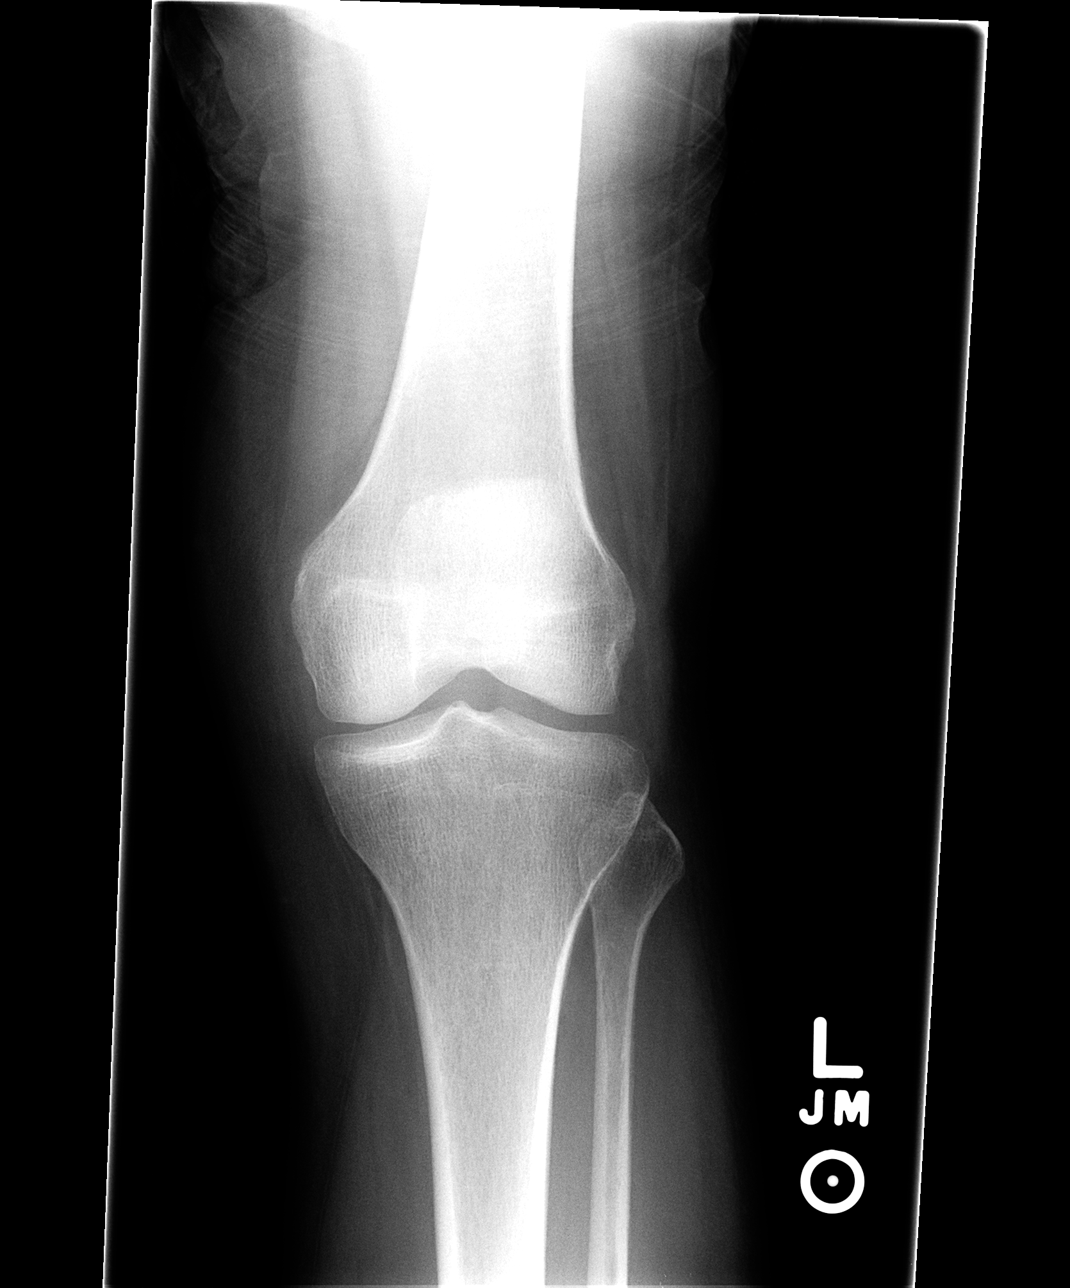

[view not recorded (2 of 4)]
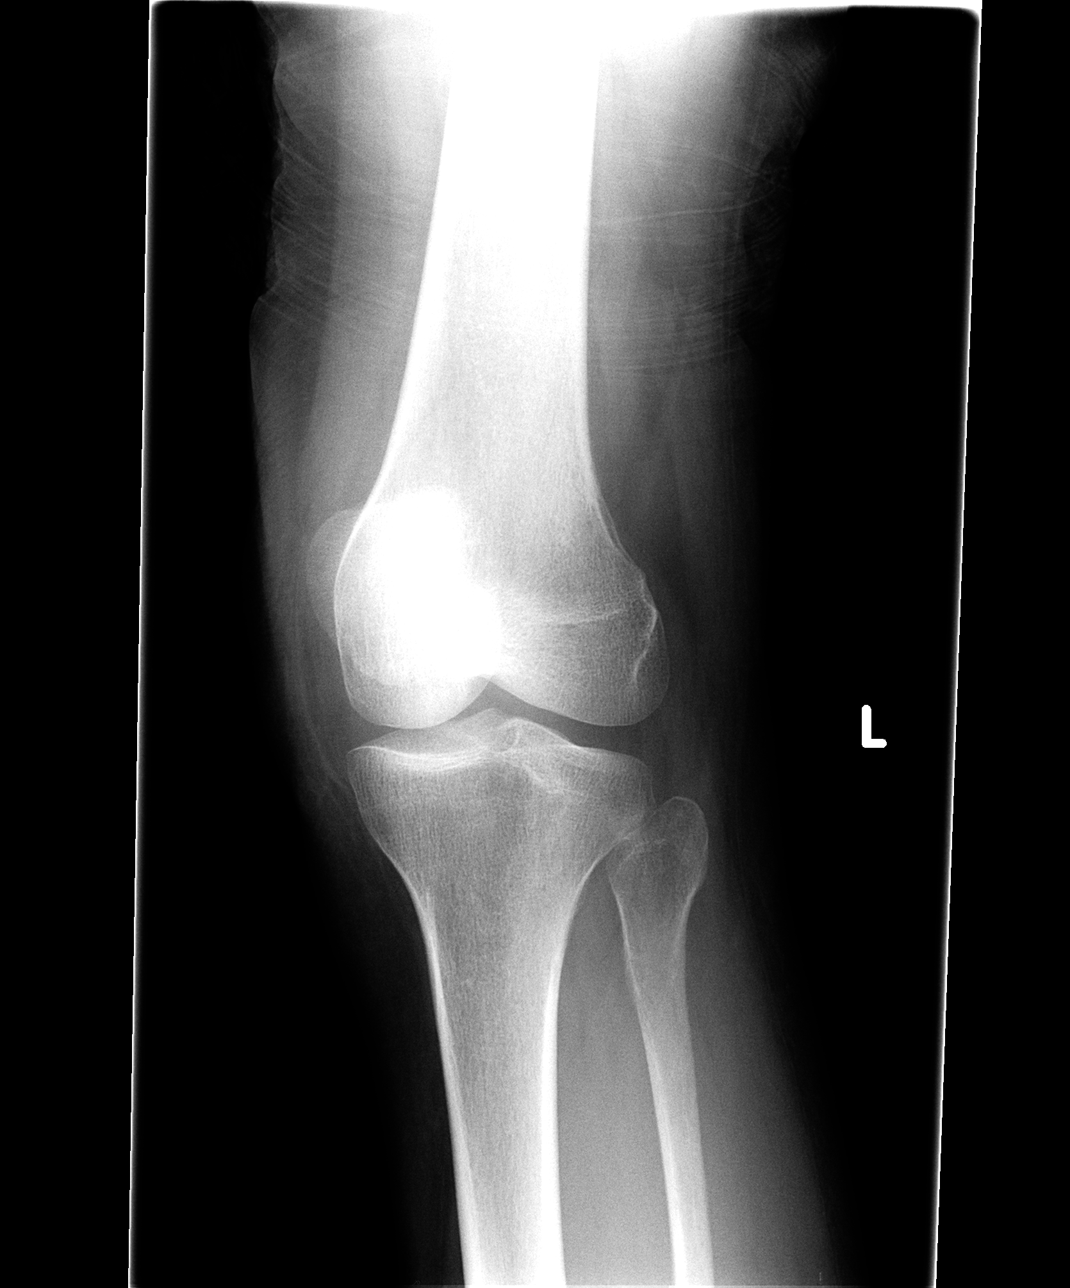

[view not recorded (3 of 4)]
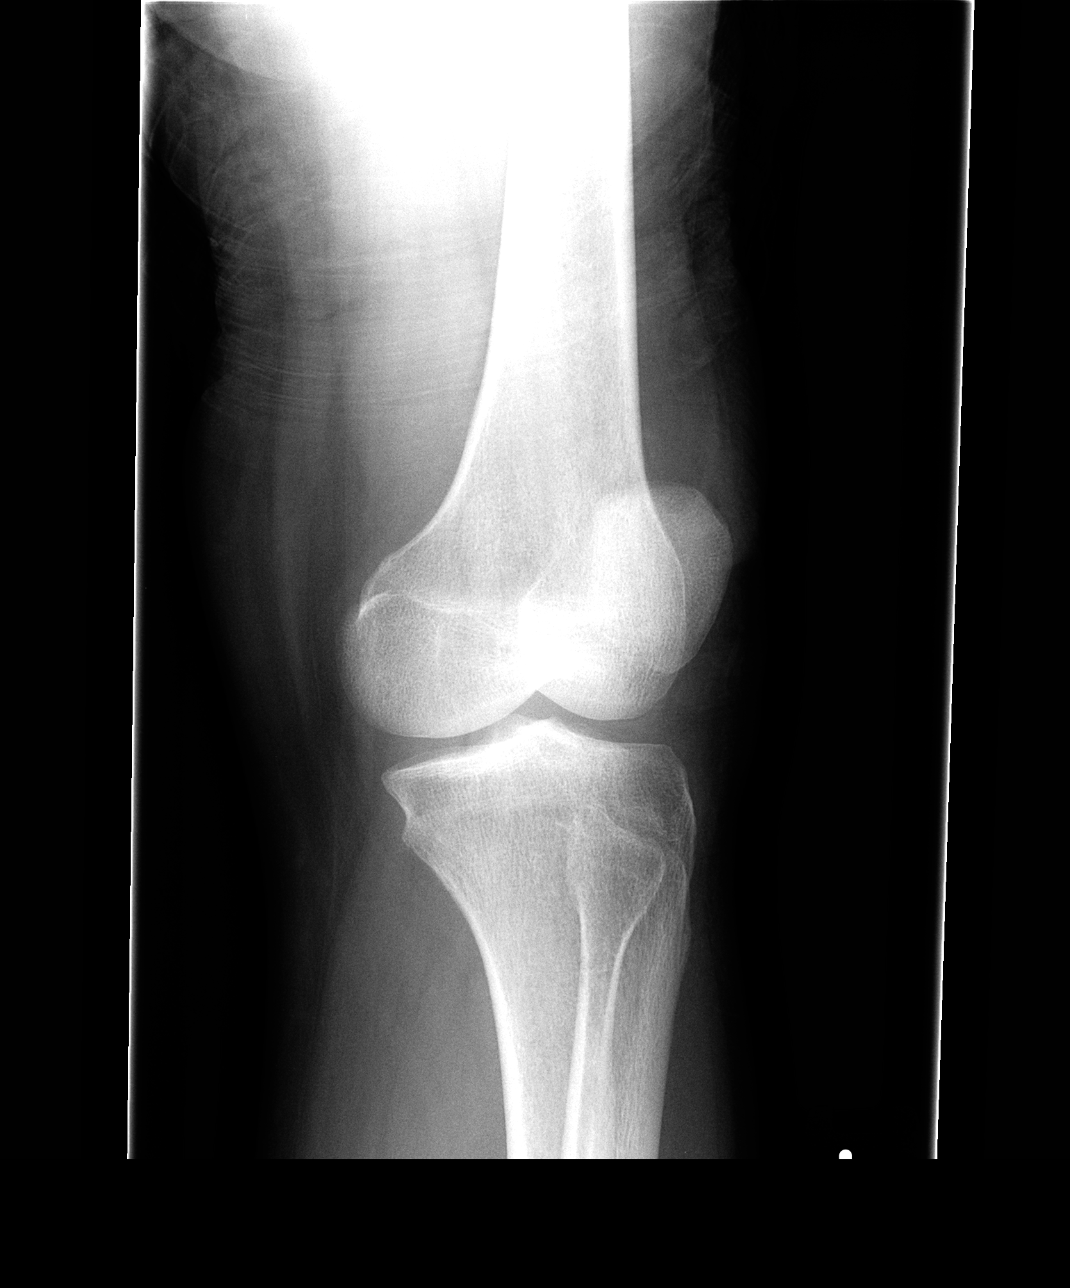

[view not recorded (4 of 4)]
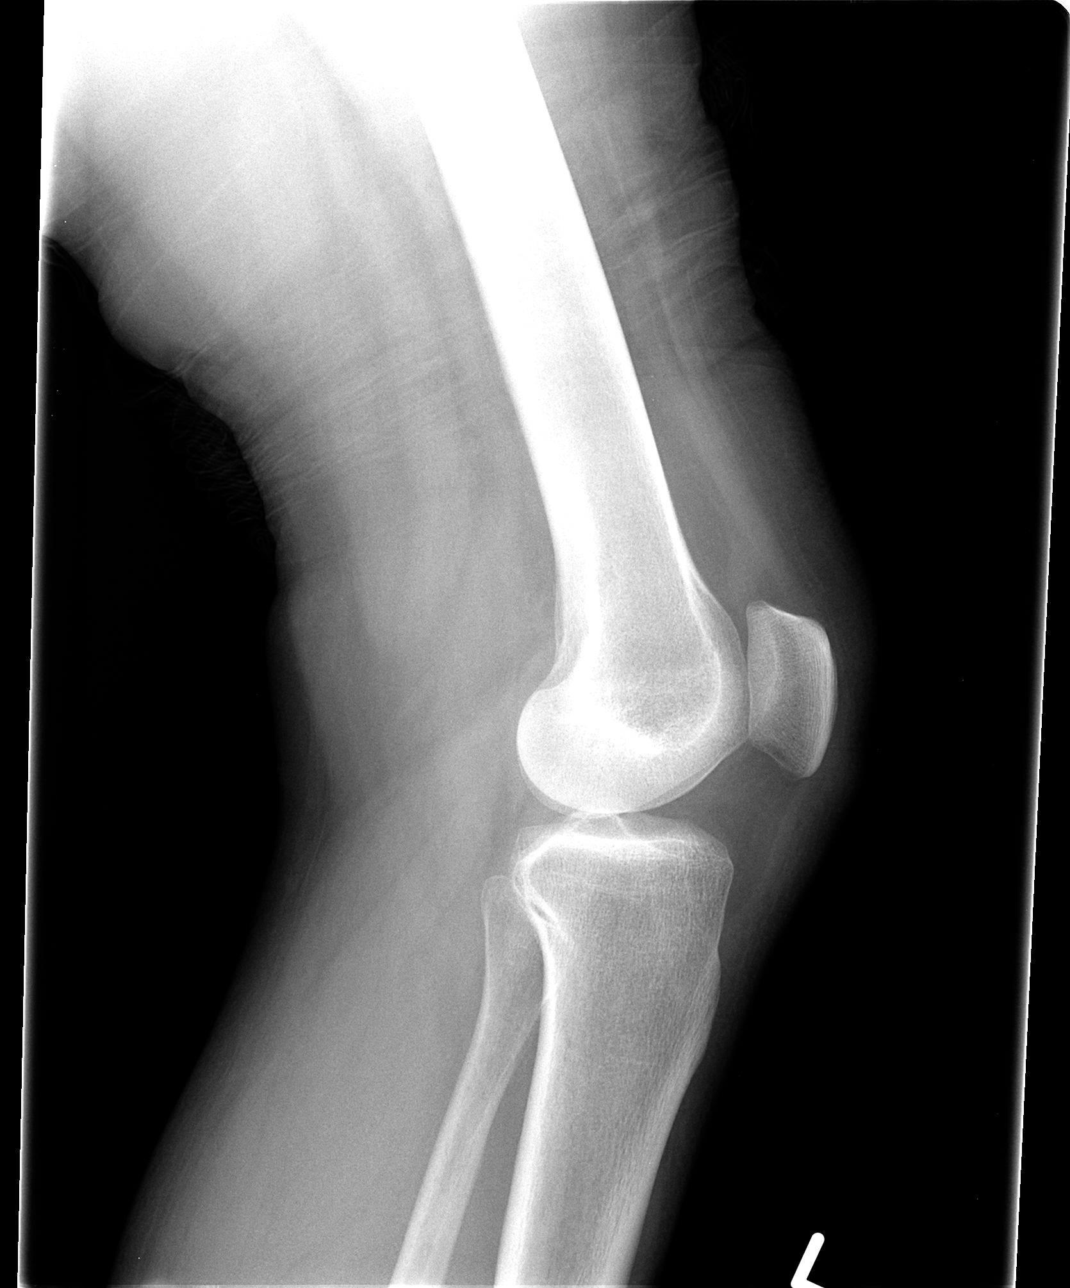

[4 of 4 positions shown; findings below may reference images not displayed]

FINDINGS: Imaged bones, joints and soft tissues appear normal.
IMPRESSION: Negative exam.

## 2014-12-05 ENCOUNTER — Other Ambulatory Visit: Payer: Self-pay | Admitting: Sports Medicine

## 2015-02-09 IMAGING — CR DG CHEST 2V
2 series · 2 of 2 positions shown · non-contrast
Comparison: None.

CLINICAL DATA: Cough for 5 days.  Smoking history.

EXAM:
CHEST  2 VIEW

[view not recorded (1 of 2)]
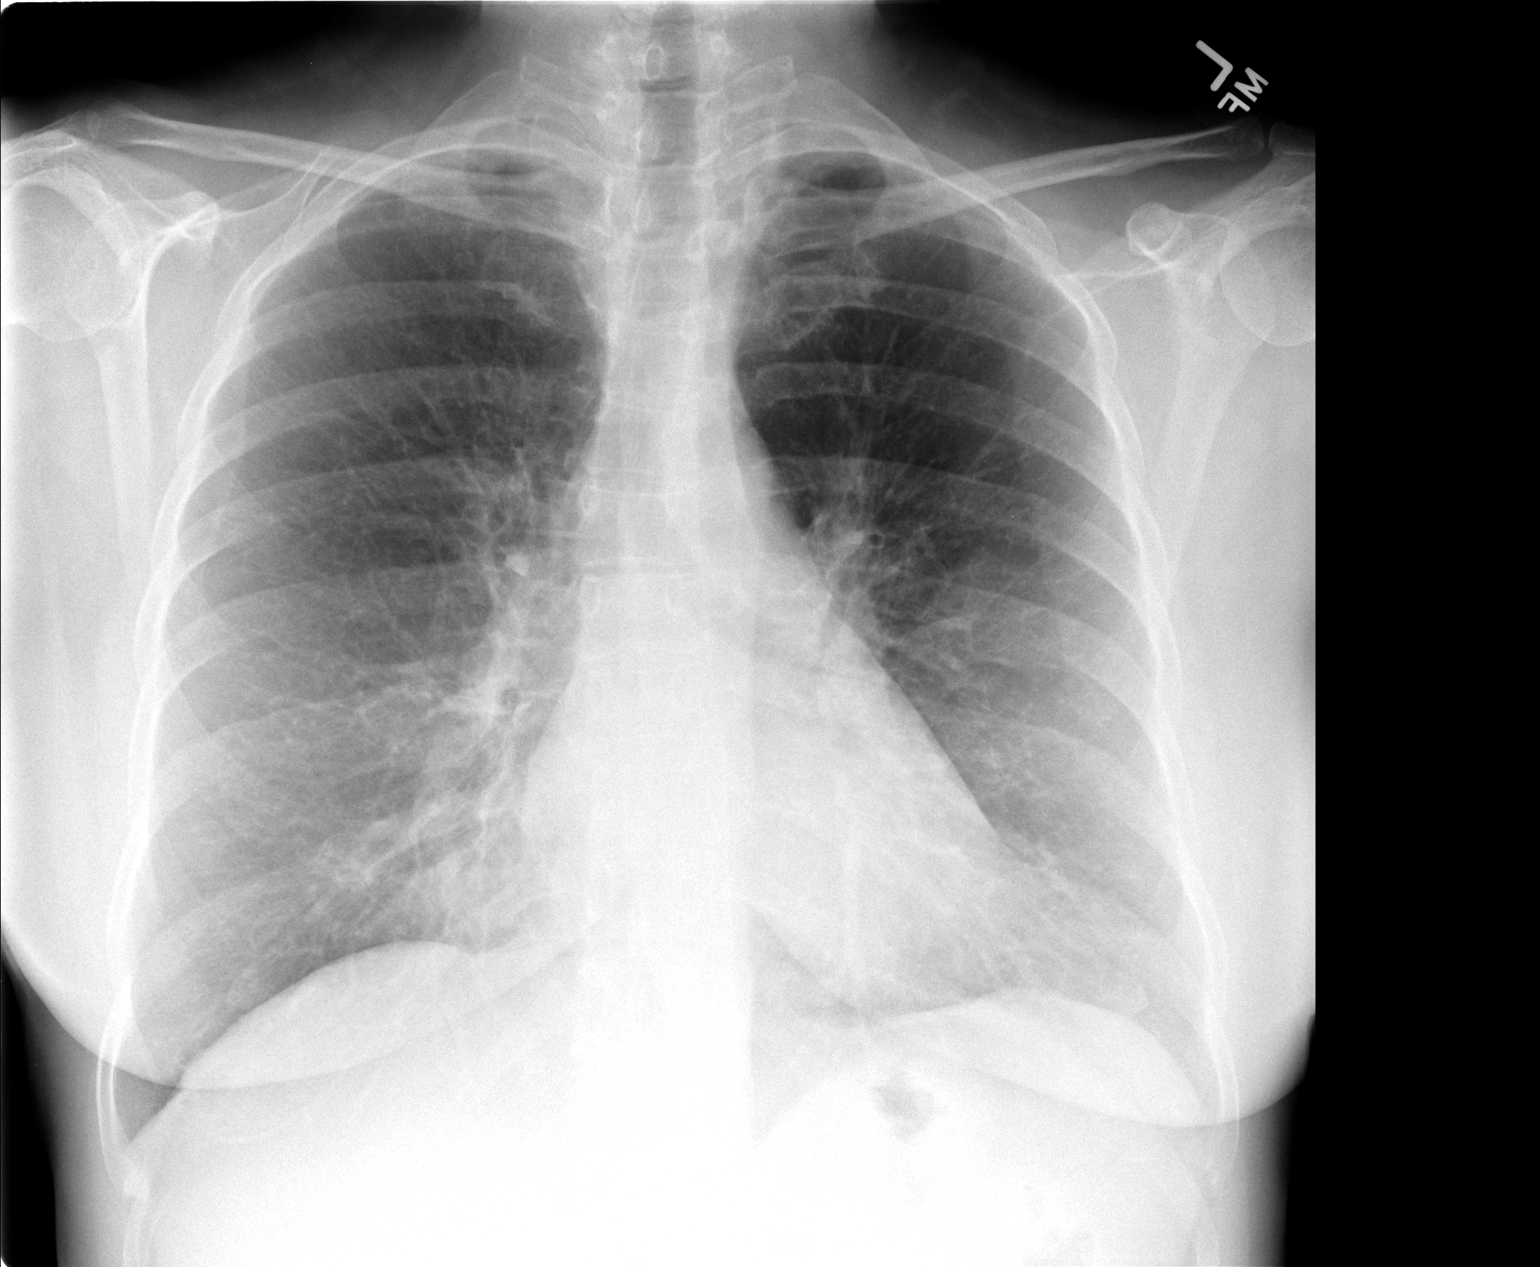

[view not recorded (2 of 2)]
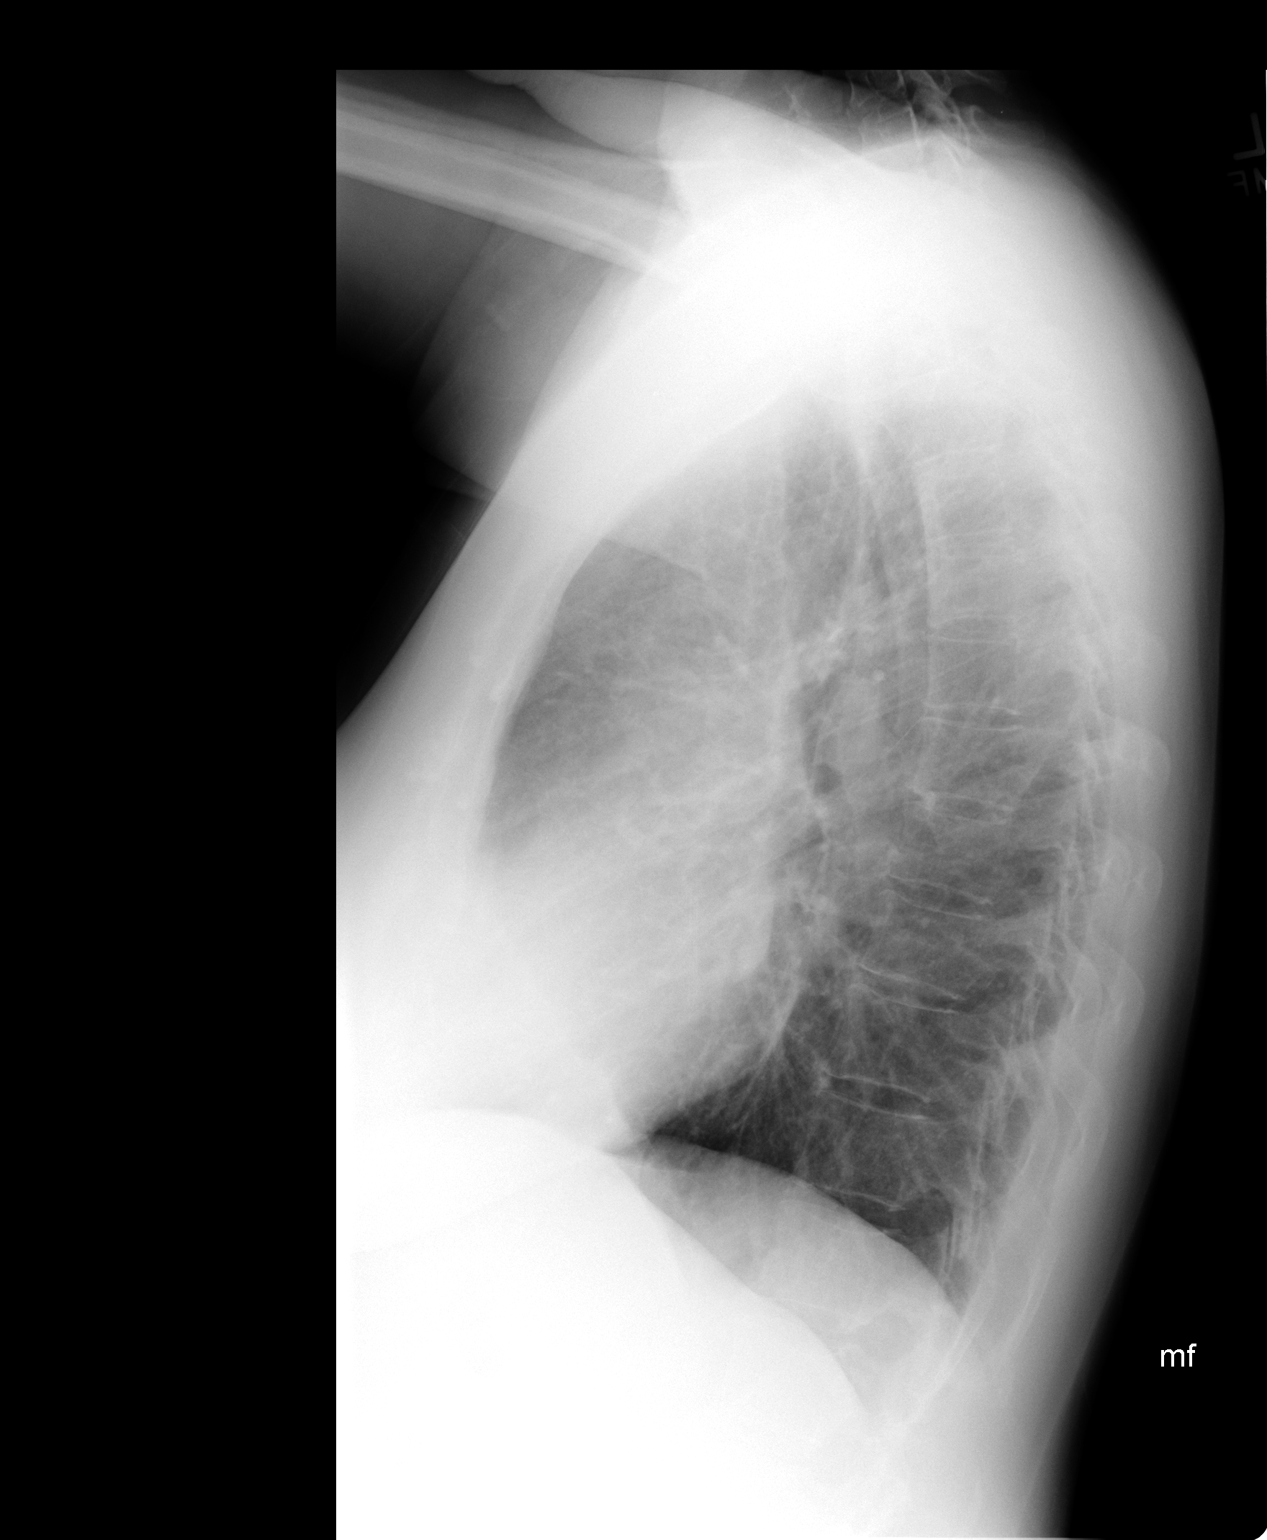

[2 of 2 positions shown; findings below may reference images not displayed]

FINDINGS: The cardiomediastinal silhouette is within normal limits. The lungs
are well inflated. Mildly increased opacity is questioned in the
right infrahilar lung base on the PA image, although no definite
abnormality is identified on the lateral radiograph. The left lung
is clear. No pleural effusion or pneumothorax is identified. No
acute osseous abnormality is identified.
IMPRESSION: Possible medial right basilar lung infectious infiltrate.

## 2015-02-18 ENCOUNTER — Other Ambulatory Visit: Payer: Self-pay | Admitting: Family Medicine

## 2015-03-21 ENCOUNTER — Other Ambulatory Visit: Payer: Self-pay | Admitting: Family Medicine

## 2015-04-08 ENCOUNTER — Other Ambulatory Visit: Payer: Self-pay | Admitting: Family Medicine

## 2015-05-07 ENCOUNTER — Other Ambulatory Visit: Payer: Self-pay | Admitting: Family Medicine

## 2015-05-13 ENCOUNTER — Other Ambulatory Visit: Payer: Self-pay | Admitting: Sports Medicine

## 2015-05-14 ENCOUNTER — Other Ambulatory Visit: Payer: Self-pay | Admitting: Family Medicine

## 2015-05-22 ENCOUNTER — Other Ambulatory Visit: Payer: Self-pay | Admitting: Family Medicine

## 2015-06-04 ENCOUNTER — Telehealth: Payer: Self-pay | Admitting: Family Medicine

## 2015-06-04 DIAGNOSIS — E785 Hyperlipidemia, unspecified: Secondary | ICD-10-CM

## 2015-06-04 MED ORDER — HYDROCHLOROTHIAZIDE 25 MG PO TABS: ORAL_TABLET | ORAL | Status: DC

## 2015-06-04 MED ORDER — ATORVASTATIN CALCIUM 20 MG PO TABS: 20.0000 mg | ORAL_TABLET | Freq: Every day | ORAL | Status: DC

## 2015-06-04 NOTE — Telephone Encounter (Signed)
Kristi Tanner, [facility] day supply [facility] to [facility] on [address]

## 2015-06-04 NOTE — Telephone Encounter (Signed)
Pt came into the office today 06/04/15 and stated that she is in between insurance and  wants to know if you can give her a 30 day refill on her hydrochlorothizide and atorvastatin so that she can get her insurance situated and then she will make an appt. She stated she should have her insurance fixed by the end of July. She only has enough medication for tomorrow, Thanks

## 2015-06-04 NOTE — Addendum Note (Signed)
Addended by: Laren Boom on: 06/04/2015 09:27 AM   Modules accepted: Orders, Medications

## 2015-06-11 ENCOUNTER — Encounter: Payer: Self-pay | Admitting: Family Medicine

## 2015-06-11 ENCOUNTER — Ambulatory Visit (INDEPENDENT_AMBULATORY_CARE_PROVIDER_SITE_OTHER): Payer: 59 | Admitting: Family Medicine

## 2015-06-11 VITALS — BP 137/62 | HR 103 | Wt 129.0 lb

## 2015-06-11 DIAGNOSIS — H538 Other visual disturbances: Secondary | ICD-10-CM

## 2015-06-11 DIAGNOSIS — I10 Essential (primary) hypertension: Secondary | ICD-10-CM

## 2015-06-11 DIAGNOSIS — E785 Hyperlipidemia, unspecified: Secondary | ICD-10-CM

## 2015-06-11 MED ORDER — HYDROCHLOROTHIAZIDE 25 MG PO TABS: ORAL_TABLET | ORAL | Status: DC

## 2015-06-11 MED ORDER — ATORVASTATIN CALCIUM 20 MG PO TABS: 20.0000 mg | ORAL_TABLET | Freq: Every day | ORAL | Status: DC

## 2015-06-11 NOTE — Progress Notes (Signed)
CC: Kristi Tanner is a 54 y.o. female is here for Hypertension   Subjective: HPI:  Requesting refill atorvastatin. She ran out of this medication some time ago and was off of it for at least a month. She restarted it last week and has had no new side effects and no history of right upper quadrant pain or myalgias. No chest pain or limb claudication  Follow essential hypertension: Ran out of hydrochlorothiazide a few weeks ago and decided that she probably didn't need to keep taking it. One day she had some mild blurred vision while she was driving and went to a local pharmacy to have her blood pressure checked and it was in the stage I hypertension range. She was able to get refills until she could follow up with me and states that the blurred vision is still present to a trace degree but improved after starting hydrochlorothiazide. She also noticed that her blurred vision actually improves if she eats something sugary juice. She denies any vision loss otherwise nor ocular pain. She denies any fevers, chills or confusion. No other motor or sensory disturbances   Review Of Systems Outlined In HPI  Past Medical History  Diagnosis Date  . Hyperlipidemia   . Hypertension     Past Surgical History  Procedure Laterality Date  . (2)c-sections     Family History  Problem Relation Age of Onset  . Hyperlipidemia Mother   . Hypertension Mother   . Diabetes Father     History   Social History  . Marital Status: Married    Spouse Name: N/A  . Number of Children: N/A  . Years of Education: N/A   Occupational History  . Not on file.   Social History Main Topics  . Smoking status: Current Every Day Smoker -- 0.50 packs/day for 40 years    Types: Cigarettes  . Smokeless tobacco: Never Used  . Alcohol Use: Yes     Comment: 1 glass of wine  . Drug Use: No  . Sexual Activity:    Partners: Male   Other Topics Concern  . Not on file   Social History Narrative  . No narrative on file      Objective: BP 137/62 mmHg  Pulse 103  Wt 129 lb (58.514 kg)  General: Alert and Oriented, No Acute Distress HEENT: Pupils equal, round, reactive to light. Conjunctivae clear.  Moist membranes pharynx unremarkable Lungs: Clear to auscultation bilaterally, no wheezing/ronchi/rales.  Comfortable work of breathing. Good air movement. Cardiac: Regular rate and rhythm. Normal S1/S2.  No murmurs, rubs, nor gallops.   Extremities: No peripheral edema.  Strong peripheral pulses.  Mental Status: No depression, anxiety, nor agitation. Skin: Warm and dry.  Assessment & Plan: Kristi Tanner was seen today for hypertension.  Diagnoses and all orders for this visit:  Hyperlipidemia Orders: -     atorvastatin (LIPITOR) 20 MG tablet; Take 1 tablet (20 mg total) by mouth daily.  Essential hypertension  Blurred vision Orders: -     Hemoglobin A1c  Other orders -     hydrochlorothiazide (HYDRODIURIL) 25 MG tablet; TAKE 1 TABLET EVERY MORNING FOR BLOOD PRESSURE CONTROL.   hyperlipidemia: Continue atorvastatin daily, after she's been on this for another 2 months I like her to return for a cholesterol panel and liver function Essential hypertension: Controlled on hydrochlorothiazide continue daily dosing Blurred vision colon ruling out hyperglycemia or type 2 diabetes, if normal will refer to ophthalmology    Return in about 3 months (around  09/11/2015) for Blood pressure and cholesterol check.Marland Kitchen

## 2015-06-12 ENCOUNTER — Telehealth: Payer: Self-pay | Admitting: Family Medicine

## 2015-06-12 DIAGNOSIS — H538 Other visual disturbances: Secondary | ICD-10-CM

## 2015-06-12 LAB — HEMOGLOBIN A1C
Hgb A1c MFr Bld: 5.4 % (ref <5.7)
Mean Plasma Glucose: 108 mg/dL (ref <117)

## 2015-06-12 NOTE — Telephone Encounter (Signed)
Kristi Tanner, Will you please let patient know that her three month average blood sugar was normal.  For further workup of her blurred vision I've placed a referral for her to visit with an opthomologist.

## 2015-06-12 NOTE — Telephone Encounter (Signed)
Pt notified of results

## 2015-06-12 NOTE — Telephone Encounter (Signed)
Called twice and phone # may be a fax #

## 2015-07-02 ENCOUNTER — Other Ambulatory Visit: Payer: Self-pay | Admitting: Family Medicine

## 2015-08-12 ENCOUNTER — Ambulatory Visit (INDEPENDENT_AMBULATORY_CARE_PROVIDER_SITE_OTHER): Payer: 59 | Admitting: Family Medicine

## 2015-08-12 ENCOUNTER — Encounter: Payer: Self-pay | Admitting: Family Medicine

## 2015-08-12 VITALS — BP 153/80 | HR 106 | Wt 126.0 lb

## 2015-08-12 DIAGNOSIS — H1013 Acute atopic conjunctivitis, bilateral: Secondary | ICD-10-CM

## 2015-08-12 DIAGNOSIS — N390 Urinary tract infection, site not specified: Secondary | ICD-10-CM | POA: Diagnosis not present

## 2015-08-12 DIAGNOSIS — E785 Hyperlipidemia, unspecified: Secondary | ICD-10-CM

## 2015-08-12 MED ORDER — CIPROFLOXACIN HCL 250 MG PO TABS: ORAL_TABLET | ORAL | Status: AC

## 2015-08-12 MED ORDER — OLOPATADINE HCL 0.2 % OP SOLN: OPHTHALMIC | Status: DC

## 2015-08-12 NOTE — Progress Notes (Signed)
CC: Kristi Tanner is a 54 y.o. female is here for Abdominal Pain and Back Pain   Subjective: HPI:  Complains of lower pelvic fullness, urinary urgency and urinary frequency present for the last 3-4 days. Seems to be persistent, nothing making it better or worse, the only intervention has been increasing fluid intake. She notices that there is a odd color to her urine but she denies any blood in urine. Denies flank pain, nor any other genitourinary complaints. No fevers, chills, nausea.  Complains of reddening of both eyes have been present for the last week. It's only involving the conjunctiva. They are itchy and feel irritated. She denies photophobia or drainage from either eye. She denies any vision loss or ocular pain. No pain with movement of the eyes were blinking. Denies headaches  Follow-up hyperlipidemia: She's been fasting once No she should have her cholesterol checked again. She's taking Lipitor on a daily basis without any right upper quadrant pain or myalgias.   Review Of Systems Outlined In HPI  Past Medical History  Diagnosis Date  . Hyperlipidemia   . Hypertension     Past Surgical History  Procedure Laterality Date  . (2)c-sections     Family History  Problem Relation Age of Onset  . Hyperlipidemia Mother   . Hypertension Mother   . Diabetes Father     Social History   Social History  . Marital Status: Married    Spouse Name: N/A  . Number of Children: N/A  . Years of Education: N/A   Occupational History  . Not on file.   Social History Main Topics  . Smoking status: Current Every Day Smoker -- 0.50 packs/day for 40 years    Types: Cigarettes  . Smokeless tobacco: Never Used  . Alcohol Use: Yes     Comment: 1 glass of wine  . Drug Use: No  . Sexual Activity:    Partners: Male   Other Topics Concern  . Not on file   Social History Narrative     Objective: BP 153/80 mmHg  Pulse 106  Wt 126 lb (57.153 kg)  General: Alert and Oriented, No  Acute Distress HEENT: Pupils equal, round, reactive to light. Conjunctivae with mild erythema, anterior chamber open bilaterally, no photophobia, no debris in the anterior chamber..  Moist mucous membranes Lungs: Clear to auscultation bilaterally, no wheezing/ronchi/rales.  Comfortable work of breathing. Good air movement. Cardiac: Regular rate and rhythm. Normal S1/S2.  No murmurs, rubs, nor gallops.   Extremities: No peripheral edema.  Strong peripheral pulses.  Mental Status: No depression, anxiety, nor agitation. Skin: Warm and dry.  Assessment & Plan: Kristi Tanner was seen today for abdominal pain and back pain.  Diagnoses and all orders for this visit:  Hyperlipidemia -     Lipid panel -     Basic Metabolic Panel (BMET)  UTI (lower urinary tract infection) -     Urine culture -     ciprofloxacin (CIPRO) 250 MG tablet; Take one by mouth twice a day for five days.  Allergic conjunctivitis, bilateral -     Olopatadine HCl 0.2 % SOLN; One-Two drop(s) twice a day in either eye as needed.   Hyperlipidemia: Due for repeat lipid panel, she has a history of elevated glucose so a metabolic panel be checked as well. Suspicion for UTI therefore start Cipro pending culture. Start Patanol as needed for allergic conjunctivitis.  Return if symptoms worsen or fail to improve.  25 minutes spent face-to-face during visit today  of which at least 50% was counseling or coordinating care regarding: 1. Hyperlipidemia   2. UTI (lower urinary tract infection)   3. Allergic conjunctivitis, bilateral

## 2015-08-13 ENCOUNTER — Telehealth: Payer: Self-pay | Admitting: Family Medicine

## 2015-08-13 DIAGNOSIS — E785 Hyperlipidemia, unspecified: Secondary | ICD-10-CM

## 2015-08-13 LAB — BASIC METABOLIC PANEL
BUN: 4 mg/dL — AB (ref 7–25)
CHLORIDE: 96 mmol/L — AB (ref 98–110)
CO2: 27 mmol/L (ref 20–31)
Calcium: 9.6 mg/dL (ref 8.6–10.4)
Creat: 0.44 mg/dL — ABNORMAL LOW (ref 0.50–1.05)
GLUCOSE: 88 mg/dL (ref 65–99)
POTASSIUM: 4.3 mmol/L (ref 3.5–5.3)
Sodium: 135 mmol/L (ref 135–146)

## 2015-08-13 LAB — URINE CULTURE
Colony Count: NO GROWTH
Organism ID, Bacteria: NO GROWTH

## 2015-08-13 LAB — LIPID PANEL
Cholesterol: 226 mg/dL — ABNORMAL HIGH (ref 125–200)
HDL: 83 mg/dL (ref >=46)
LDL Cholesterol: 126 mg/dL (ref <130)
Total CHOL/HDL Ratio: 2.7 Ratio (ref <=5.0)
Triglycerides: 85 mg/dL (ref <150)
VLDL: 17 mg/dL (ref <30)

## 2015-08-13 MED ORDER — ATORVASTATIN CALCIUM 40 MG PO TABS: 40.0000 mg | ORAL_TABLET | Freq: Every day | ORAL | Status: DC

## 2015-08-13 NOTE — Telephone Encounter (Signed)
Kristi Tanner, Will you please let patient know that her LDL cholesterol has improved compared to when she was not taking atorvastatin but is still slightly above normal therefore I'd recommend starting a  formulation that I've sent to [facility].

## 2015-08-13 NOTE — Telephone Encounter (Signed)
Pt.notified

## 2015-08-27 ENCOUNTER — Ambulatory Visit (INDEPENDENT_AMBULATORY_CARE_PROVIDER_SITE_OTHER): Payer: 59 | Admitting: Family Medicine

## 2015-08-27 ENCOUNTER — Encounter: Payer: Self-pay | Admitting: Family Medicine

## 2015-08-27 VITALS — BP 136/65 | HR 100 | Temp 98.2°F | Wt 125.0 lb

## 2015-08-27 DIAGNOSIS — Z1239 Encounter for other screening for malignant neoplasm of breast: Secondary | ICD-10-CM

## 2015-08-27 DIAGNOSIS — R1030 Lower abdominal pain, unspecified: Secondary | ICD-10-CM

## 2015-08-27 DIAGNOSIS — J302 Other seasonal allergic rhinitis: Secondary | ICD-10-CM

## 2015-08-27 DIAGNOSIS — IMO0001 Reserved for inherently not codable concepts without codable children: Secondary | ICD-10-CM

## 2015-08-27 DIAGNOSIS — R61 Generalized hyperhidrosis: Secondary | ICD-10-CM | POA: Diagnosis not present

## 2015-08-27 LAB — COMPLETE METABOLIC PANEL WITH GFR
ALT: 23 U/L (ref 6–29)
AST: 33 U/L (ref 10–35)
Albumin: 4.6 g/dL (ref 3.6–5.1)
Alkaline Phosphatase: 59 U/L (ref 33–130)
BUN: 6 mg/dL — AB (ref 7–25)
CHLORIDE: 95 mmol/L — AB (ref 98–110)
CO2: 24 mmol/L (ref 20–31)
CREATININE: 0.44 mg/dL — AB (ref 0.50–1.05)
Calcium: 9 mg/dL (ref 8.6–10.4)
GFR, Est African American: >89 mL/min (ref >=60)
GFR, Est Non African American: >89 mL/min (ref >=60)
Glucose, Bld: 80 mg/dL (ref 65–99)
POTASSIUM: 4.2 mmol/L (ref 3.5–5.3)
Sodium: 132 mmol/L — ABNORMAL LOW (ref 135–146)
Total Bilirubin: 0.5 mg/dL (ref 0.2–1.2)
Total Protein: 7.5 g/dL (ref 6.1–8.1)

## 2015-08-27 LAB — T4, FREE: FREE T4: 1.26 ng/dL (ref 0.80–1.80)

## 2015-08-27 LAB — TSH: TSH: 1.786 u[IU]/mL (ref 0.350–4.500)

## 2015-08-27 LAB — T3, FREE: T3 FREE: 2.9 pg/mL (ref 2.3–4.2)

## 2015-08-27 MED ORDER — MONTELUKAST SODIUM 10 MG PO TABS: 10.0000 mg | ORAL_TABLET | Freq: Every day | ORAL | Status: AC

## 2015-08-27 NOTE — Progress Notes (Signed)
CC: Kristi Tanner is a 54 y.o. female is here for f/u bladder issues   Subjective: HPI:  Complains of low abdominal pain that has been present now for a little more than a month. It's described only has pain. It comes and goes throughout the day nothing particularly makes it better or worse. It radiates into the low back. She's had no gynecologic concerns or symptoms. She denies any vaginal bleeding or vaginal discharge. Pain feels like it's internal. She's had urinary urgency but has not been experiencing any frequency or dysuria. New symptoms that are joining this are sweating of the palms and heat sensation in the upper and lower extremities.denies constipation or diarrhea. No nauseousness or vomiting. Another new symptom is a crampy sensation in the upper abdomen  She continues to have reddening of the eyes despite using Patanol eyedrops. She denies any vision loss or drainage from the eyes. Occasionally they will be itchy.  they're worse first thing in the morning otherwise nothing seems to make better or worse. Denies any photophobia.   Review Of Systems Outlined In HPI  Past Medical History  Diagnosis Date  . Hyperlipidemia   . Hypertension     Past Surgical History  Procedure Laterality Date  . (2)c-sections     Family History  Problem Relation Age of Onset  . Hyperlipidemia Mother   . Hypertension Mother   . Diabetes Father     Social History   Social History  . Marital Status: Married    Spouse Name: N/A  . Number of Children: N/A  . Years of Education: N/A   Occupational History  . Not on file.   Social History Main Topics  . Smoking status: Current Every Day Smoker -- 0.50 packs/day for 40 years    Types: Cigarettes  . Smokeless tobacco: Never Used  . Alcohol Use: Yes     Comment: 1 glass of wine  . Drug Use: No  . Sexual Activity:    Partners: Male   Other Topics Concern  . Not on file   Social History Narrative     Objective: BP 136/65 mmHg  Pulse  100  Temp(Src) 98.2 F (36.8 C) (Oral)  Wt 125 lb (56.7 kg)  General: Alert and Oriented, No Acute Distress HEENT: Pupils equal, round, reactive to light. Conjunctivae with mild reddening bilaterally.  Moist mucous membranes Lungs: clear comfortable work of breathing Cardiac: Regular rate and rhythm.  Abdomen: Normal bowel sounds, soft and non tender without palpable masses.no palpable masses, rebound tenderness nor guarding. I'm unable to reproduce her pain she's not expense and pain at the time of the exam. Extremities: No peripheral edema.  Strong peripheral pulses.  Mental Status: No depression, anxiety, nor agitation. Skin: Warm and dry.  Assessment & Plan: [name] was seen today for f/u bladder issues.  Diagnoses and all orders for this visit:  Lower abdominal pain -     Urine Culture -     TSH -     T4, free -     T3, free -     COMPLETE METABOLIC PANEL WITH GFR -     CBC  Sweating -     Urine Culture -     TSH -     T4, free -     T3, free -     COMPLETE METABOLIC PANEL WITH GFR -     CBC  Screening for breast cancer -     MM DIGITAL SCREENING BILATERAL  Seasonal  allergies  Other orders -     montelukast (SINGULAIR) 10 MG tablet; Take 1 tablet (10 mg total) by mouth at bedtime.   Lower abdominal pain: Reculturing urine, rule out thyroid abnormality metabolic abnormality or abnormal blood cell counts Sweating could be due to hyperthyroidism therefore checking thyroid function. Allergies: Stop Patanol drops switched to Singulair if and only if labs are normal today. She needs a fresh order for mammogram, she was never contacted for scheduling this I have asked her to go downstairs to introduce herself and schedule with radiology.  Return if symptoms worsen or fail to improve.

## 2015-08-28 ENCOUNTER — Other Ambulatory Visit: Payer: Self-pay | Admitting: *Deleted

## 2015-08-28 ENCOUNTER — Telehealth: Payer: Self-pay | Admitting: Family Medicine

## 2015-08-28 DIAGNOSIS — R102 Pelvic and perineal pain: Secondary | ICD-10-CM

## 2015-08-28 DIAGNOSIS — R109 Unspecified abdominal pain: Secondary | ICD-10-CM

## 2015-08-28 NOTE — Telephone Encounter (Signed)
Called again and number still connected to fax, Myriam Jacobson in imaging has also tried to call and cannot get through because phone is connected to a fax

## 2015-08-28 NOTE — Telephone Encounter (Signed)
Pt notified of labs and pt has been scheduled CT on Tuesday. She is going out of town now

## 2015-08-28 NOTE — Telephone Encounter (Signed)
Pt called. She said to call her on her cell ph. #[phone].  Thank you.

## 2015-08-28 NOTE — Telephone Encounter (Signed)
Kristi Tanner, Will you please let patient know that her blood work did not show any explanation for her pelvic pain, although her WBC count is still pending I'd recommend she have a CT scan for further evaluation of her pain, an order has been placed, can you confirm that radiology is scheduling the patient today?

## 2015-08-28 NOTE — Telephone Encounter (Signed)
Called pt on the # listed twice and # is connected to fax

## 2015-08-29 LAB — URINE CULTURE
Colony Count: NO GROWTH
ORGANISM ID, BACTERIA: NO GROWTH

## 2015-08-30 LAB — CBC

## 2015-09-03 ENCOUNTER — Other Ambulatory Visit: Payer: 59

## 2015-09-05 ENCOUNTER — Ambulatory Visit: Payer: 59

## 2015-09-19 ENCOUNTER — Telehealth: Payer: Self-pay | Admitting: *Deleted

## 2015-09-19 NOTE — Telephone Encounter (Signed)
Pt called and wanting to know results of CBC. Results said canceled in the computer. Called [facility] to see why lab was cancelled. [facility] states the specimen came in without a lavender top so they were unable to perform a cbc.  Pt's situation has changed in that she no longer has insurance. She canceled CT due to cost. She is still having pelvic pain but it is not as bad as it was. Routing this message to see if you have any other  Recommendations keeping in mind that pt states she will not be able to have any imaging done until she gets insurance

## 2015-09-19 NOTE — Telephone Encounter (Signed)
I'm not sure what's causing her pain, the CT scan is the next best step in trying to get to the bottom of her pain.

## 2015-09-20 NOTE — Telephone Encounter (Signed)
Pt notified and she states she will do the CT when she can. I advised her to call us if any new sxs appeared and/or if her pain got worse. Pt agreed and voiced understanding

## 2015-09-21 ENCOUNTER — Other Ambulatory Visit: Payer: Self-pay | Admitting: Sports Medicine

## 2015-11-19 ENCOUNTER — Other Ambulatory Visit: Payer: Self-pay | Admitting: Family Medicine

## 2016-01-12 ENCOUNTER — Other Ambulatory Visit: Payer: Self-pay | Admitting: Family Medicine

## 2016-01-21 ENCOUNTER — Other Ambulatory Visit: Payer: Self-pay | Admitting: Sports Medicine

## 2016-02-24 ENCOUNTER — Encounter: Payer: Self-pay | Admitting: Family Medicine

## 2016-02-24 ENCOUNTER — Ambulatory Visit (INDEPENDENT_AMBULATORY_CARE_PROVIDER_SITE_OTHER): Payer: Self-pay | Admitting: Family Medicine

## 2016-02-24 VITALS — BP 191/84 | HR 120 | Wt 118.0 lb

## 2016-02-24 DIAGNOSIS — E785 Hyperlipidemia, unspecified: Secondary | ICD-10-CM

## 2016-02-24 DIAGNOSIS — I10 Essential (primary) hypertension: Secondary | ICD-10-CM

## 2016-02-24 DIAGNOSIS — M19041 Primary osteoarthritis, right hand: Secondary | ICD-10-CM

## 2016-02-24 DIAGNOSIS — M19042 Primary osteoarthritis, left hand: Secondary | ICD-10-CM

## 2016-02-24 MED ORDER — MELOXICAM 15 MG PO TABS: ORAL_TABLET | ORAL | Status: DC

## 2016-02-24 MED ORDER — ATORVASTATIN CALCIUM 40 MG PO TABS: 40.0000 mg | ORAL_TABLET | Freq: Every day | ORAL | Status: AC

## 2016-02-24 MED ORDER — HYDROCHLOROTHIAZIDE 25 MG PO TABS: 25.0000 mg | ORAL_TABLET | Freq: Every day | ORAL | Status: DC

## 2016-02-24 NOTE — Patient Instructions (Signed)
BP readings daily for 5-7 days:                  .

## 2016-02-24 NOTE — Progress Notes (Signed)
CC: Kristi Tanner is a 55 y.o. female is here for Hypertension; Medication Refill; red eyes; and Excessive Sweating   Subjective: HPI:  Follow-up hyperlipidemia: She is grossly refill on lovastatin. She is taking this with 100% compliance and no side effects. No myalgias or right upper quadrant pain  Follow-up essential hypertension: She ran out of hydrochlorothiazide a few weeks ago. She is not checking her blood pressure anywhere but our office. She denies chest pain shortness of breath orthopnea nor peripheral edema.  She's requesting a refill meloxicam. She uses most days of the week for her bilateral hand pain localized in the knuckles and wrists. Symptoms are absent if she takes this medication daily. She denies any swelling redness or warmth of any of the hand joints.   Review Of Systems Outlined In HPI  Past Medical History  Diagnosis Date  . Hyperlipidemia   . Hypertension     Past Surgical History  Procedure Laterality Date  . (2)c-sections     Family History  Problem Relation Age of Onset  . Hyperlipidemia Mother   . Hypertension Mother   . Diabetes Father     Social History   Social History  . Marital Status: Married    Spouse Name: N/A  . Number of Children: N/A  . Years of Education: N/A   Occupational History  . Not on file.   Social History Main Topics  . Smoking status: Current Every Day Smoker -- 0.50 packs/day for 40 years    Types: Cigarettes  . Smokeless tobacco: Never Used  . Alcohol Use: Yes     Comment: 1 glass of wine  . Drug Use: No  . Sexual Activity:    Partners: Male   Other Topics Concern  . Not on file   Social History Narrative     Objective: BP 191/84 mmHg  Pulse 120  Wt 118 lb (53.524 kg)  Vital signs reviewed. General: Alert and Oriented, No Acute Distress HEENT: Pupils equal, round, reactive to light. Conjunctivae clear.  External ears unremarkable.  Moist mucous membranes. Lungs: Clear and comfortable work of  breathing, speaking in full sentences without accessory muscle use. Cardiac: Regular rate and rhythm.  Neuro: CN II-XII grossly intact, gait normal. Extremities: No peripheral edema.  Strong peripheral pulses.  Mental Status: No depression, anxiety, nor agitation. Logical though process. Skin: Warm and dry. Assessment & Plan: Kristi Tanner was seen today for hypertension, medication refill, red eyes and excessive sweating.  Diagnoses and all orders for this visit:  Hyperlipidemia -     atorvastatin (LIPITOR) 40 MG tablet; Take 1 tablet (40 mg total) by mouth daily.  Essential hypertension -     hydrochlorothiazide (HYDRODIURIL) 25 MG tablet; Take 1 tablet (25 mg total) by mouth daily.  Primary osteoarthritis of both hands  Other orders -     meloxicam (MOBIC) 15 MG tablet; TAKE 1 TABLET EVERY MORNING WITH BREAKFAST 2 WEEKS THEN DAILY AS NEEDED PAIN   Hyperlipidemia: Currently controlled continue atorvastatin Essential hypertension: Uncontrolled chronic condition restart hydrochlorothiazide and drop-off in blood pressure log 5-7 days after starting this medication.  Osteoarthritis: Controlled with as needed use of meloxicam   Return in about 6 months (around 08/26/2016).

## 2016-03-04 ENCOUNTER — Telehealth: Payer: Self-pay | Admitting: Family Medicine

## 2016-03-04 MED ORDER — LISINOPRIL-HYDROCHLOROTHIAZIDE 20-12.5 MG PO TABS
1.0000 | ORAL_TABLET | Freq: Every day | ORAL | Status: DC
Start: 2016-03-04 — End: 2016-03-09

## 2016-03-04 NOTE — Telephone Encounter (Signed)
Will you please let patient know that her blood pressure remains uncontrolled despite taking hydrochlorothiazide therefore I'd recommend she switch to a different formulation that contains both hydrochlorothiazide and lisinopril.  [facility] [address] I'd recommend f/u 1-2 months.

## 2016-03-04 NOTE — Telephone Encounter (Signed)
Pt.notified

## 2016-03-09 ENCOUNTER — Ambulatory Visit (INDEPENDENT_AMBULATORY_CARE_PROVIDER_SITE_OTHER): Payer: Self-pay | Admitting: Family Medicine

## 2016-03-09 ENCOUNTER — Encounter: Payer: Self-pay | Admitting: Family Medicine

## 2016-03-09 VITALS — BP 185/66 | HR 111 | Wt 116.0 lb

## 2016-03-09 DIAGNOSIS — I10 Essential (primary) hypertension: Secondary | ICD-10-CM

## 2016-03-09 MED ORDER — CARVEDILOL 25 MG PO TABS: 25.0000 mg | ORAL_TABLET | Freq: Two times a day (BID) | ORAL | Status: DC

## 2016-03-09 NOTE — Patient Instructions (Signed)
Daily BP Values for one week:                        . 

## 2016-03-09 NOTE — Progress Notes (Signed)
CC: Kristi Tanner is a 55 y.o. female is here for Hypertension   Subjective: HPI:  Despite taking lisinopril-hydrochlorothiazide for 4 consecutive days now her blood pressures are still reaching the stage II hypertension range. She'll wake up in the morning and she'll be normal however slowly rise as the day goes on. This is despite taking her medication in the morning. She occasionally gets a headache or a tremor when her blood pressure is elevated but it's not predictable and 100% reproducible. She denies any other motor or sensory disturbances.   Review Of Systems Outlined In HPI  Past Medical History  Diagnosis Date  . Hyperlipidemia   . Hypertension     Past Surgical History  Procedure Laterality Date  . (2)c-sections     Family History  Problem Relation Age of Onset  . Hyperlipidemia Mother   . Hypertension Mother   . Diabetes Father     Social History   Social History  . Marital Status: Married    Spouse Name: N/A  . Number of Children: N/A  . Years of Education: N/A   Occupational History  . Not on file.   Social History Main Topics  . Smoking status: Current Every Day Smoker -- 0.50 packs/day for 40 years    Types: Cigarettes  . Smokeless tobacco: Never Used  . Alcohol Use: Yes     Comment: 1 glass of wine  . Drug Use: No  . Sexual Activity:    Partners: Male   Other Topics Concern  . Not on file   Social History Narrative     Objective: BP 185/66 mmHg  Pulse 111  Wt 116 lb (52.617 kg)  General: Alert and Oriented, No Acute Distress HEENT: Pupils equal, round, reactive to light. Conjunctivae clear.Moist mucous membranes Lungs: Clear to auscultation bilaterally, no wheezing/ronchi/rales.  Comfortable work of breathing. Good air movement. Cardiac: Regular rate and rhythm. Normal S1/S2.  No murmurs, rubs, nor gallops.   Extremities: No peripheral edema.  Strong peripheral pulses.  Mental Status: No depression, anxiety, nor agitation. Skin: Warm and  dry.  Assessment & Plan: Kristi Tanner was seen today for hypertension.  Diagnoses and all orders for this visit:  Essential hypertension  Other orders -     carvedilol (COREG) 25 MG tablet; Take 1 tablet (25 mg total) by mouth 2 (two) times daily with a meal.  Essential hypertension: Uncontrolled chronic condition stopping lisinopril-HCTZ and switching to carvedilol. I've encouraged her to take 12.5 mg twice a day for the first 3 days and then proceeded to take a full tablet twice a day thereafter. I've also asked her to give me a list of blood pressure values like she did following her last visit.  Return in about 4 weeks (around 04/06/2016).

## 2016-03-16 ENCOUNTER — Telehealth: Payer: Self-pay

## 2016-03-16 NOTE — Telephone Encounter (Signed)
Pt is still having high BP along with dizziness.  Should she be seen or can another medication be sent in? Please advise.

## 2016-03-16 NOTE — Telephone Encounter (Signed)
First try two tablets of carvediolol twice a day and i'll add onto it if there's no benefit by the end of the week.  

## 2016-03-16 NOTE — Telephone Encounter (Signed)
Pt just started taking a whole tablet of carvedilol on Friday. She said that her BP spikes during the afternoon hours.  She has been having dizziness, & blurred vision.  She feels that the medication makes her feel cloudy.  She said that you said something about blood work and wants to know do you still want labs drawn? Please advise.

## 2016-03-17 NOTE — Telephone Encounter (Signed)
First try two tablets of carvediolol twice a day and i'll add onto it if there's no benefit by the end of the week.

## 2016-03-17 NOTE — Telephone Encounter (Signed)
Pt.notified

## 2016-03-20 ENCOUNTER — Ambulatory Visit (INDEPENDENT_AMBULATORY_CARE_PROVIDER_SITE_OTHER): Payer: Self-pay | Admitting: Family Medicine

## 2016-03-20 ENCOUNTER — Encounter: Payer: Self-pay | Admitting: Family Medicine

## 2016-03-20 VITALS — BP 173/77 | HR 72 | Wt 116.0 lb

## 2016-03-20 DIAGNOSIS — I10 Essential (primary) hypertension: Secondary | ICD-10-CM

## 2016-03-20 LAB — TSH: TSH: 1.17 mIU/L

## 2016-03-20 MED ORDER — BENAZEPRIL-HYDROCHLOROTHIAZIDE 20-25 MG PO TABS: 2.0000 | ORAL_TABLET | Freq: Every day | ORAL | Status: DC

## 2016-03-20 NOTE — Progress Notes (Signed)
CC: Kristi Tanner is a 55 y.o. female is here for Hypertension; Rash; and Dizziness   Subjective: HPI:  Essential hypertension: Systolic her lashes been taking 25 mg of carvedilol twice a day. When she started taking this medication she began to break out in hives and is getting worse on a daily basis. Her blood pressure log shows normotensive readings in the morning however between the hours of 11 AM-6 PM her blood pressure spikes in the stage II hypertension range. Also she feels fatigued within 30 minutes after taking each dose of carvedilol. She denies any difficulty breathing, angioedema or fevers. She denies any motor or sensory disturbances other than itching at the sites of her hives localized on the back and abdomen.   Review Of Systems Outlined In HPI  Past Medical History  Diagnosis Date  . Hyperlipidemia   . Hypertension     Past Surgical History  Procedure Laterality Date  . (2)c-sections     Family History  Problem Relation Age of Onset  . Hyperlipidemia Mother   . Hypertension Mother   . Diabetes Father     Social History   Social History  . Marital Status: Married    Spouse Name: N/A  . Number of Children: N/A  . Years of Education: N/A   Occupational History  . Not on file.   Social History Main Topics  . Smoking status: Current Every Day Smoker -- 0.50 packs/day for 40 years    Types: Cigarettes  . Smokeless tobacco: Never Used  . Alcohol Use: Yes     Comment: 1 glass of wine  . Drug Use: No  . Sexual Activity:    Partners: Male   Other Topics Concern  . Not on file   Social History Narrative     Objective: BP 173/77 mmHg  Pulse 72  Wt 116 lb (52.617 kg)  General: Alert and Oriented, No Acute Distress HEENT: Pupils equal, round, reactive to light. Conjunctivae Slightly erythematous.  Moist mucous membranes Lungs: Clear to auscultation bilaterally, no wheezing/ronchi/rales.  Comfortable work of breathing. Good air movement. Cardiac: Regular  rate and rhythm. Normal S1/S2.  No murmurs, rubs, nor gallops.   Extremities: No peripheral edema.  Strong peripheral pulses.  Mental Status: No depression, anxiety, nor agitation. Skin: Warm and dry.  Assessment & Plan: Kristi Tanner was seen today for hypertension, rash and dizziness.  Diagnoses and all orders for this visit:  Essential hypertension -     benazepril-hydrochlorthiazide (LOTENSIN HCT) 20-25 MG tablet; Take 2 tablets by mouth daily. -     TSH -     BASIC METABOLIC PANEL WITH GFR   Essential hypertension: Uncontrolled, stopping carvedilol switching back to ACE/diuretic, lisinopril was ineffective. Vascular drop off blood pressure log within the next week to see the response of the new medication.   Return if symptoms worsen or fail to improve.

## 2016-03-21 LAB — BASIC METABOLIC PANEL WITH GFR
BUN: 7 mg/dL (ref 7–25)
CALCIUM: 9.3 mg/dL (ref 8.6–10.4)
CO2: 22 mmol/L (ref 20–31)
CREATININE: 0.53 mg/dL (ref 0.50–1.05)
Chloride: 103 mmol/L (ref 98–110)
GFR, Est African American: >89 mL/min (ref >=60)
GFR, Est Non African American: >89 mL/min (ref >=60)
GLUCOSE: 77 mg/dL (ref 65–99)
Potassium: 4.5 mmol/L (ref 3.5–5.3)
SODIUM: 138 mmol/L (ref 135–146)

## 2016-03-23 ENCOUNTER — Telehealth: Payer: Self-pay | Admitting: Family Medicine

## 2016-03-23 MED ORDER — HYDROXYZINE HCL 25 MG PO TABS: 25.0000 mg | ORAL_TABLET | Freq: Three times a day (TID) | ORAL | Status: DC | PRN

## 2016-03-23 NOTE — Telephone Encounter (Signed)
Pt stated that her BP spiked and her eyes where blood shot red with blurred vision. Please advise.

## 2016-03-23 NOTE — Telephone Encounter (Signed)
Will you please let patient know I sent a different antihistimine to replace benadryl.

## 2016-03-24 ENCOUNTER — Encounter: Payer: Self-pay | Admitting: *Deleted

## 2016-03-24 ENCOUNTER — Emergency Department
Admission: EM | Admit: 2016-03-24 | Discharge: 2016-03-24 | Disposition: A | Discharge status: Home / Self Care | Payer: Self-pay | Source: Home / Self Care | Attending: Family Medicine | Admitting: Family Medicine

## 2016-03-24 DIAGNOSIS — L508 Other urticaria: Secondary | ICD-10-CM

## 2016-03-24 DIAGNOSIS — L509 Urticaria, unspecified: Secondary | ICD-10-CM

## 2016-03-24 DIAGNOSIS — F419 Anxiety disorder, unspecified: Secondary | ICD-10-CM

## 2016-03-24 DIAGNOSIS — I10 Essential (primary) hypertension: Secondary | ICD-10-CM

## 2016-03-24 MED ORDER — METHYLPREDNISOLONE SODIUM SUCC 40 MG IJ SOLR
80.0000 mg | Freq: Once | INTRAMUSCULAR | Status: AC
  Administered 2016-03-24: 80 mg via INTRAMUSCULAR

## 2016-03-24 MED ORDER — PREDNISONE 20 MG PO TABS: ORAL_TABLET | ORAL | Status: DC

## 2016-03-24 NOTE — ED Notes (Signed)
Pt is here today for elevated BP and shaking x 3 wks.

## 2016-03-24 NOTE — Discharge Instructions (Signed)
You were given a shot of decadron (a steroid) today to help with itching and swelling from a likely allergic reaction.  You have been prescribed 3 days of prednisone, an oral steroid.  You may start this medication tomorrow with breakfast.    Please be sure to pick up the medication, hydroxyzine (Atarax/Vistaril) Dr. Ivan AnchorsHommel called in for you yesterday.  This medication can cause drowsiness, do not drink alcohol or drive while taking.   This medication should help with itching and anxiety.  Please continue to take your new blood pressure medication, benazepril-hydrochlorothiazide as prescribed by Dr. Ivan AnchorsHommel.  If symptoms not improving in 3-4 days, please call his office schedule a follow up appointment by Thursday or Friday of this week.  If symptoms worsen, including chest pain, shortness of breath, or other new concerning symptoms develop, please call 911 or go to closest emergency department for further evaluation and treatment of your symptoms.

## 2016-03-24 NOTE — ED Provider Notes (Signed)
CSN: [id number]     Arrival date & time 03/24/16  1540 History   First MD Initiated Contact with Patient 03/24/16 1601     Chief Complaint  Patient presents with  . Hypertension   (Consider location/radiation/quality/duration/timing/severity/associated sxs/prior Treatment) HPI The pt is a 54yo female presenting to [facility] with c/o elevated blood pressure over the last 3 weeks.  She last saw her PCP 3 days ago and had her BP medication changed from carvedilol to benazepril-hydrochlorothiazide due to getting hives and not feeling well with carvedilol.  She has been taking the new BP medication as prescribed but states her systolic BP yesterday was in the 200s.  Today she feels like her BP is elevated again. Pt notes she feels shaky and itchy all over with some intermittent SOB when her BP is very high. Denies chest pain or SOB at this time. Denies headache or change in vision.  Her PCP, Dr. Ivan Anchors, did call in atarax yesterday but pt has not been to pharmacy to pick up yet.  She was notified today she had normal TSH and BMP, which was drawn on 03/20/16.   Denies hx of anxiety or increased stress. Pt does smoke cigarettes and occasionally drinks alcohol but denies increase or decrease in consumption of either.  Past Medical History  Diagnosis Date  . Hyperlipidemia   . Hypertension    Past Surgical History  Procedure Laterality Date  . (2)c-sections     Family History  Problem Relation Age of Onset  . Hyperlipidemia Mother   . Hypertension Mother   . Diabetes Father    Social History  Substance Use Topics  . Smoking status: Current Every Day Smoker -- 0.50 packs/day for 40 years    Types: Cigarettes  . Smokeless tobacco: Never Used  . Alcohol Use: Yes     Comment: 1 glass of wine   OB History    No data available     Review of Systems  Constitutional: Negative for fever and chills.  HENT: Negative for congestion, ear pain, sore throat, trouble swallowing and voice change.    Respiratory: Positive for shortness of breath (occasionally with the high BP). Negative for cough.   Cardiovascular: Negative for chest pain and palpitations.  Gastrointestinal: Negative for nausea, vomiting, abdominal pain and diarrhea.  Musculoskeletal: Negative for myalgias, back pain and arthralgias.  Skin: Positive for rash ( pruritic ). Negative for wound.  Neurological: Negative for dizziness, light-headedness and headaches.  Psychiatric/Behavioral: The patient is nervous/anxious ( shaky).     Allergies  Asa and Coreg  Home Medications   Prior to Admission medications   Medication Sig Start Date End Date Taking? Authorizing Provider  albuterol (PROVENTIL HFA;VENTOLIN HFA) 108 (90 BASE) MCG/ACT inhaler Inhale two puffs every 4-6 hours only as needed for shortness of breath or wheezing. 06/19/14 06/19/15  Laren Boom, DO  atorvastatin (LIPITOR) 40 MG tablet Take 1 tablet (40 mg total) by mouth daily. 02/24/16   Sean Hommel, DO  benazepril-hydrochlorthiazide (LOTENSIN HCT) 20-25 MG tablet Take 2 tablets by mouth daily. 03/20/16   Laren Boom, DO  hydrOXYzine (ATARAX/VISTARIL) 25 MG tablet Take 1 tablet (25 mg total) by mouth 3 (three) times daily as needed for itching. 03/23/16   Laren Boom, DO  Krill Oil 300 MG CAPS Take 300 mg by mouth.    Historical Provider, MD  meloxicam (MOBIC) 15 MG tablet TAKE 1 TABLET EVERY MORNING WITH BREAKFAST 2 WEEKS THEN DAILY AS NEEDED PAIN 02/24/16   Laren Boom,  DO  montelukast (SINGULAIR) 10 MG tablet Take 1 tablet (10 mg total) by mouth at bedtime. 08/27/15   Laren BoomSean Hommel, DO  Multiple Vitamins-Calcium (ONE-A-DAY WOMENS FORMULA PO) Take by mouth.    Historical Provider, MD  Olopatadine HCl 0.2 % SOLN One-Two drop(s) twice a day in either eye as needed. 08/12/15   Laren BoomSean Hommel, DO  predniSONE (DELTASONE) 20 MG tablet 2 tabs po daily x 3 days 03/24/16   Junius FinnerErin O'Malley, PA-C  ranitidine (ZANTAC) 150 MG tablet Take 150 mg by mouth 2 (two) times daily.    Historical  Provider, MD   Meds Ordered and Administered this Visit   Medications  methylPREDNISolone sodium succinate (SOLU-MEDROL) 40 mg/mL injection 80 mg (80 mg Intramuscular Given 03/24/16 1617)    BP 169/95 mmHg  Pulse 107  Temp(Src) 98.2 F (36.8 C) (Oral)  Resp 16  Ht 5\' 2"  (1.575 m)  Wt 115 lb (52.164 kg)  BMI 21.03 kg/m2  SpO2 96% No data found.   Physical Exam  Constitutional: She appears well-developed and well-nourished. No distress.  Sitting in exam chair, appears anxious. fidgeting and shaky  HENT:  Head: Normocephalic and atraumatic.  Eyes: Conjunctivae are normal. No scleral icterus.  Neck: Normal range of motion. Neck supple.  Cardiovascular: Regular rhythm and normal heart sounds.  Tachycardia present.   Mild tachycardia, regular rhythm  Pulmonary/Chest: Effort normal and breath sounds normal. No respiratory distress. She has no wheezes. She has no rales. She exhibits no tenderness.  Abdominal: Soft. She exhibits no distension. There is no tenderness.  Musculoskeletal: Normal range of motion.  Neurological: She is alert.  Skin: Skin is warm and dry. Rash noted. She is not diaphoretic. There is erythema.  Diffuse erythematous papular rash on back and arms. Non-tender. No bleeding or discharge.   Psychiatric: Her speech is normal. Her mood appears anxious. She is hyperactive.  Jittery and shaking  Nursing note and vitals reviewed.   ED Course  Procedures (including critical care time)  Labs Review Labs Reviewed - No data to display  Imaging Review No results found.    MDM   1. Essential hypertension   2. Anxiety   3. Full body hives    Pt c/o elevated BP despite taking BP medication as prescribed. Pt also c/o diffuse erythematous, pruritic rash.    Pt appears anxious on exam. HR- mild tachycardia- 107, no respiratory distress. BP: 169/95  Rash on back and arms, could be due lingering carvedilol as she just stopped taking that medication 3-4 days ago.     Tx in UC: Solumedrol 80mg  IM  Strongly encouraged pt to pick up prescription for her atarax to see if that helps with her itching and anxiety.  BP today is acceptable. Encouraged to keep taking her new BP medication as prescribed. F/u with PCP in 3-4 days if not improving, sooner if worsening.  Discussed symptoms that warrant emergent care in the ED. Patient verbalized understanding and agreement with treatment plan.     Junius Finnerrin O'Malley, PA-C 03/24/16 1700

## 2016-03-31 NOTE — Telephone Encounter (Signed)
Patient being evaluated at local hospital for hyponatrema

## 2020-02-06 ENCOUNTER — Encounter: Payer: Self-pay | Admitting: Emergency Medicine

## 2020-02-06 ENCOUNTER — Emergency Department
Admission: EM | Admit: 2020-02-06 | Discharge: 2020-02-06 | Disposition: A | Discharge status: Home / Self Care | Payer: BLUE CROSS/BLUE SHIELD | Source: Home / Self Care | Attending: Family Medicine | Admitting: Family Medicine

## 2020-02-06 ENCOUNTER — Other Ambulatory Visit: Payer: Self-pay

## 2020-02-06 DIAGNOSIS — J069 Acute upper respiratory infection, unspecified: Secondary | ICD-10-CM | POA: Diagnosis not present

## 2020-02-06 DIAGNOSIS — M940 Chondrocostal junction syndrome [Tietze]: Secondary | ICD-10-CM

## 2020-02-06 HISTORY — DX: Unspecified intracranial injury with loss of consciousness status unknown, initial encounter: S06.9XAA

## 2020-02-06 HISTORY — DX: Unspecified intracranial injury with loss of consciousness of unspecified duration, initial encounter: S06.9X9A

## 2020-02-06 LAB — POC SARS CORONAVIRUS 2 AG -  ED: SARS Coronavirus 2 Ag: NEGATIVE

## 2020-02-06 MED ORDER — AZITHROMYCIN 250 MG PO TABS: ORAL_TABLET | ORAL | 0 refills | Status: DC

## 2020-02-06 NOTE — Discharge Instructions (Addendum)
Take plain guaifenesin (1200mg extended release tabs such as Mucinex) twice daily, with plenty of water, for cough and congestion.   Get adequate rest.   May use Afrin nasal spray (or generic oxymetazoline) each morning for about 5 days and then discontinue.  Also recommend using saline nasal spray several times daily and saline nasal irrigation (AYR is a common brand).  Use Flonase nasal spray each morning after using Afrin nasal spray and saline nasal irrigation. Try warm salt water gargles for sore throat.  Stop all antihistamines for now, and other non-prescription cough/cold preparations. May take Delsym Cough Suppressant at bedtime for nighttime cough.   Isolate yourself until COVID-19 test result is available.   If your COVID19 test is positive, then you are infected with the novel coronavirus and could give the virus to others.  Please continue isolation at home for at least 10 days since the start of your symptoms. Once you complete your 10 day quarantine, you may return to normal activities as long as you've not had a fever for over 24 hours (without taking fever reducing medicine) and your symptoms are improving. Please continue good preventive care measures, including:  frequent hand-washing, avoid touching your face, cover coughs/sneezes, stay out of crowds and keep a 6 foot distance from others.  Go to the nearest hospital emergency room if fever/cough/breathlessness are severe or illness seems like a threat to life.  

## 2020-02-06 NOTE — ED Triage Notes (Signed)
Cough & nasal congestion since Saturday  No fevers

## 2020-02-06 NOTE — ED Provider Notes (Signed)
[facility]    CSN: [id number] Arrival date & time: 02/06/20  1908      History   Chief Complaint Chief Complaint  Patient presents with  . Nasal Congestion    since Saturday  . Cough    HPI Kristi Tanner is a 59 y.o. female.   Four days ago patient began to develop an increased cough, followed by headache, fatigue, myalgias, nasal congestion, and intermittent loose stools.   She denies chest tightness, shortness of breath, and changes in taste/smell. She has a past history of pneumonia and continues to smoke.  The history is provided by the patient.    Past Medical History:  Diagnosis Date  . Hyperlipidemia   . Hypertension   . Traumatic brain injury [facility])     Patient Active Problem List   Diagnosis Date Noted  . Essential hypertension 06/11/2015  . 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 Preventive measure 02/05/2014  . Vision loss 02/05/2014  . Hyperlipidemia 02/05/2014  . Vasomotor flushing 02/05/2014  . Fracture of radial head, right, closed 02/02/2014  . Closed nondisplaced fracture of base of fifth metacarpal bone of right hand 02/02/2014  . Cyst of finger 02/02/2014    Past Surgical History:  Procedure Laterality Date  . (2)c-sections      OB History   No obstetric history on file.      Home Medications    Prior to Admission medications   Medication Sig Start Date End Date Taking? Authorizing Provider  albuterol (PROVENTIL HFA;VENTOLIN HFA) 108 (90 BASE) MCG/ACT inhaler Inhale two puffs every 4-6 hours only as needed for shortness of breath or wheezing. 06/19/14 06/19/15  Laren Boom, DO  atorvastatin (LIPITOR) 40 MG tablet Take 1 tablet (40 mg total) by mouth daily. 02/24/16   Hommel, Gregary Signs, DO  azithromycin (ZITHROMAX Z-PAK) 250 MG tablet Take 2 tabs today; then begin one tab once daily for 4 more days. 02/06/20   Lattie Haw, MD  benazepril-hydrochlorthiazide (LOTENSIN HCT) 20-25 MG tablet Take 2 tablets by mouth daily. 03/20/16   Laren Boom, DO  hydrOXYzine (ATARAX/VISTARIL) 25 MG tablet Take 1 tablet (25 mg total) by mouth 3 (three) times daily as needed for itching. 03/23/16   Laren Boom, DO  Krill Oil 300 MG CAPS Take 300 mg by mouth.    [provider]  meloxicam (MOBIC) 15 MG tablet TAKE 1 TABLET EVERY MORNING WITH BREAKFAST 2 WEEKS THEN DAILY AS NEEDED PAIN 02/24/16   Hommel, Sean, DO  montelukast (SINGULAIR) 10 MG tablet Take 1 tablet (10 mg total) by mouth at bedtime. 08/27/15   Laren Boom, DO  Multiple Vitamins-Calcium (ONE-A-DAY WOMENS FORMULA PO) Take by mouth.    [provider]  Olopatadine HCl 0.2 % SOLN One-Two drop(s) twice a day in either eye as needed. 08/12/15   Laren Boom, DO  predniSONE (DELTASONE) 20 MG tablet 2 tabs po daily x 3 days 03/24/16   Lurene Shadow, PA-C  ranitidine (ZANTAC) 150 MG tablet Take 150 mg by mouth 2 (two) times daily.    [provider]    Family History Family History  Problem Relation Age of Onset  . Hyperlipidemia Mother   . Hypertension Mother   . Diabetes Father     Social History Social History   Tobacco Use  . Smoking status: Current Every Day Smoker    Packs/day: 0.50    Years: 40.00    Pack years: 20.00    Types: Cigarettes  . Smokeless tobacco:  Never Used  Substance Use Topics  . Alcohol use: Yes    Comment: 1 glass of wine  . Drug use: No     Allergies   Asa [aspirin], Coreg [carvedilol], and Hctz [hydrochlorothiazide]   Review of Systems Review of Systems No sore throat + cough No pleuritic pain No wheezing + nasal congestion + post-nasal drainage No sinus pain/pressure No itchy/red eyes No earache No hemoptysis No SOB No fever/chills No nausea No vomiting No abdominal pain No diarrhea No urinary symptoms No skin rash + fatigue + myalgias + headache Used OTC meds without relief   Physical Exam Triage Vital Signs ED Triage Vitals  Enc Vitals Group     BP 02/06/20 1935 103/70     Pulse Rate  02/06/20 1935 (!) 104     Resp 02/06/20 1935 20     Temp 02/06/20 1935 98.4 F (36.9 C)     Temp Source 02/06/20 1935 Oral     SpO2 02/06/20 1935 99 %     Weight 02/06/20 1936 136 lb (61.7 kg)     Height 02/06/20 1936 5\' 2"  (1.575 m)     Head Circumference --      Peak Flow --      Pain Score --      Pain Loc --      Pain Edu? --      Excl. in Rule? --    No data found.  Updated Vital Signs BP 103/70 (BP Location: Left Arm)   Pulse (!) 104   Temp 98.4 F (36.9 C) (Oral)   Resp 20   Ht 5\' 2"  (1.575 m)   Wt 61.7 kg   SpO2 99%   BMI 24.87 kg/m   Visual Acuity Right Eye Distance:   Left Eye Distance:   Bilateral Distance:    Right Eye Near:   Left Eye Near:    Bilateral Near:     Physical Exam Nursing notes and Vital Signs reviewed. Appearance:  Patient appears stated age, and in no acute distress Eyes:  Pupils are equal, round, and reactive to light and accomodation.  Extraocular movement is intact.  Conjunctivae are not inflamed  Ears:  Canals normal.  Tympanic membranes normal.  Nose:  Mildly congested turbinates.  No sinus tenderness.  Pharynx:  Normal Neck:  Supple.  Mildly enlarged lateral nodes are present, tender to palpation on the left.   Lungs:  Faint bibasilar anterior wheezes.  Breath sounds are equal.  Moving air well. Chest:  Distinct tenderness to palpation over the mid-sternum.  Heart:  Regular rate and rhythm without murmurs, rubs, or gallops.  Abdomen:  Nontender without masses or hepatosplenomegaly.  Bowel sounds are present.  No CVA or flank tenderness.  Extremities:  No edema.  Skin:  No rash present.   UC Treatments / Results  Labs (all labs ordered are listed, but only abnormal results are displayed) Labs Reviewed  NOVEL CORONAVIRUS, NAA  POC SARS CORONAVIRUS 2 AG -  ED negative    EKG   Radiology No results found.  Procedures Procedures (including critical care time)  Medications Ordered in UC Medications - No data to  display  Initial Impression / Assessment and Plan / UC Course  I have reviewed the triage vital signs and the nursing notes.  Pertinent labs & imaging results that were available during my care of the patient were reviewed by me and considered in my medical decision making (see chart for details).    Because  of her smoking history and past history of pneumonia, will begin Z-pak. COVID19 send out.   Final Clinical Impressions(s) / UC Diagnoses   Final diagnoses:  Viral URI with cough  Costochondritis     Discharge Instructions     Take plain guaifenesin (1200mg  extended release tabs such as Mucinex) twice daily, with plenty of water, for cough and congestion. Get adequate rest.   May use Afrin nasal spray (or generic oxymetazoline) each morning for about 5 days and then discontinue.  Also recommend using saline nasal spray several times daily and saline nasal irrigation (AYR is a common brand).  Use Flonase nasal spray each morning after using Afrin nasal spray and saline nasal irrigation. Try warm salt water gargles for sore throat.  Stop all antihistamines for now, and other non-prescription cough/cold preparations. May take Delsym Cough Suppressant at bedtime for nighttime cough.   Isolate yourself until COVID-19 test result is available.   If your COVID19 test is positive, then you are infected with the novel coronavirus and could give the virus to others.  Please continue isolation at home for at least 10 days since the start of your symptoms.  Once you complete your 10 day quarantine, you may return to normal activities as long as you've not had a fever for over 24 hours (without taking fever reducing medicine) and your symptoms are improving. Please continue good preventive care measures, including:  frequent hand-washing, avoid touching your face, cover coughs/sneezes, stay out of crowds and keep a 6 foot distance from others.  Go to the nearest hospital emergency room if  fever/cough/breathlessness are severe or illness seems like a threat to life.     ED Prescriptions    Medication Sig Dispense Auth. Provider   azithromycin (ZITHROMAX Z-PAK) 250 MG tablet Take 2 tabs today; then begin one tab once daily for 4 more days. 6 tablet , MD        Lattie Haw, MD 02/07/20 2220

## 2020-02-08 LAB — NOVEL CORONAVIRUS, NAA: SARS-CoV-2, NAA: NOT DETECTED

## 2021-07-03 ENCOUNTER — Inpatient Hospital Stay
Admission: RE | Admit: 2021-07-03 | Discharge: 2021-07-03 | Disposition: A | Discharge status: Home / Self Care | Payer: Self-pay | Source: Ambulatory Visit

## 2021-07-03 ENCOUNTER — Other Ambulatory Visit: Payer: Self-pay

## 2021-07-03 ENCOUNTER — Emergency Department
Admission: EM | Admit: 2021-07-03 | Discharge: 2021-07-03 | Disposition: A | Discharge status: Home / Self Care | Payer: Medicaid Other | Source: Home / Self Care

## 2021-07-03 ENCOUNTER — Encounter: Payer: Self-pay | Admitting: Emergency Medicine

## 2021-07-03 DIAGNOSIS — L03116 Cellulitis of left lower limb: Secondary | ICD-10-CM | POA: Diagnosis not present

## 2021-07-03 DIAGNOSIS — B372 Candidiasis of skin and nail: Secondary | ICD-10-CM | POA: Diagnosis not present

## 2021-07-03 HISTORY — DX: Alcohol use, unspecified with alcohol-induced persisting amnestic disorder: F10.96

## 2021-07-03 HISTORY — DX: Amnestic disorder due to known physiological condition: F04

## 2021-07-03 HISTORY — DX: Hyperlipidemia, unspecified: E78.5

## 2021-07-03 MED ORDER — NYSTATIN 100000 UNIT/GM EX CREA: TOPICAL_CREAM | CUTANEOUS | 0 refills | Status: DC

## 2021-07-03 MED ORDER — AMOXICILLIN-POT CLAVULANATE 875-125 MG PO TABS: 1.0000 | ORAL_TABLET | Freq: Two times a day (BID) | ORAL | 0 refills | Status: AC

## 2021-07-03 NOTE — ED Triage Notes (Signed)
Sore in left groin area, patient wears depends and thinks it rubbed a raw area x 2 days

## 2021-07-03 NOTE — Discharge Instructions (Addendum)
Advised Patient/daughter to take medication with food as directed to completion.  Advised patient to wear proper size of depends preferably extra-large and to keep affected area dry and clean at all times.  Advised patient/daughter may use OTC Zeesorb for increased dryness.

## 2021-07-03 NOTE — ED Provider Notes (Signed)
[facility]    CSN: [id number] Arrival date & time: 07/03/21  1805      History   Chief Complaint Chief Complaint  Patient presents with   Sore    HPI Kristi Tanner is a 60 y.o. female.   HPI 60 year old female presents with rash of groin for 2 days and is accompanied by her daughter this evening.  Patient/mother report multiple bouts of incontinence with poor hygiene, patient currently wearing depends pull-up that are 2-3 sizes too small for her.  Past Medical History:  Diagnosis Date   Dyslipidemia    Hyperlipidemia    Hypertension    Korsakoff syndrome (HCC)    Traumatic brain injury (HCC)    Wernicke-Korsakoff syndrome (alcoholic) (HCC)     Patient Active Problem List   Diagnosis Date Noted   Essential hypertension 06/11/2015   Osteoarthritis, hand 03/19/2014   Preventive measure 02/05/2014   Vision loss 02/05/2014   Hyperlipidemia 02/05/2014   Vasomotor flushing 02/05/2014   Fracture of radial head, right, closed 02/02/2014   Closed nondisplaced fracture of base of fifth metacarpal bone of right hand 02/02/2014   Cyst of finger 02/02/2014    Past Surgical History:  Procedure Laterality Date   (2)c-sections      OB History   No obstetric history on file.      Home Medications    Prior to Admission medications   Medication Sig Start Date End Date Taking? Authorizing Provider  cholecalciferol (VITAMIN D3) 25 MCG (1000 UNIT) tablet Take 1,000 Units by mouth daily.   Yes [provider]  cloNIDine (CATAPRES - DOSED IN MG/24 HR) 0.1 mg/24hr patch Place 0.1 mg onto the skin once a week.   Yes [provider]  divalproex (DEPAKOTE ER) 250 MG 24 hr tablet Take 250 mg by mouth daily.   Yes [provider]  divalproex (DEPAKOTE) 125 MG DR tablet Take 125 mg by mouth 3 (three) times daily.   Yes [provider]  escitalopram (LEXAPRO) 20 MG tablet Take 20 mg by mouth daily.   Yes [provider]   gabapentin (NEURONTIN) 400 MG capsule Take 400 mg by mouth 3 (three) times daily.   Yes [provider]  QUEtiapine (SEROQUEL) 50 MG tablet Take 50 mg by mouth at bedtime.   Yes [provider]  albuterol (PROVENTIL HFA;VENTOLIN HFA) 108 (90 BASE) MCG/ACT inhaler Inhale two puffs every 4-6 hours only as needed for shortness of breath or wheezing. 06/19/14 06/19/15  Laren Boom, DO  amoxicillin-clavulanate (AUGMENTIN) 875-125 MG tablet Take 1 tablet by mouth every 12 (twelve) hours for 10 days. 07/03/21 07/13/21 Yes Trevor Iha, FNP  atorvastatin (LIPITOR) 40 MG tablet Take 1 tablet (40 mg total) by mouth daily. 02/24/16   Hommel, Gregary Signs, DO  azithromycin (ZITHROMAX Z-PAK) 250 MG tablet Take 2 tabs today; then begin one tab once daily for 4 more days. 02/06/20   Lattie Haw, MD  benazepril-hydrochlorthiazide (LOTENSIN HCT) 20-25 MG tablet Take 2 tablets by mouth daily. 03/20/16   Laren Boom, DO  hydrOXYzine (ATARAX/VISTARIL) 25 MG tablet Take 1 tablet (25 mg total) by mouth 3 (three) times daily as needed for itching. 03/23/16   Laren Boom, DO  Krill Oil 300 MG CAPS Take 300 mg by mouth.    [provider]  meloxicam (MOBIC) 15 MG tablet TAKE 1 TABLET EVERY MORNING WITH BREAKFAST 2 WEEKS THEN DAILY AS NEEDED PAIN 02/24/16   Hommel, Sean, DO  montelukast (SINGULAIR) 10 MG tablet  Take 1 tablet (10 mg total) by mouth at bedtime. 08/27/15   Laren Boom, DO  Multiple Vitamins-Calcium (ONE-A-DAY WOMENS FORMULA PO) Take by mouth.    [provider]  nystatin cream (MYCOSTATIN) Apply to affected area left inguinal crease 2 times daily x10 days 07/03/21  Yes Trevor Iha, FNP  Olopatadine HCl 0.2 % SOLN One-Two drop(s) twice a day in either eye as needed. 08/12/15   Laren Boom, DO  predniSONE (DELTASONE) 20 MG tablet 2 tabs po daily x 3 days 03/24/16   Lurene Shadow, PA-C  ranitidine (ZANTAC) 150 MG tablet Take 150 mg by mouth 2 (two) times daily.    [provider]    Family History Family History  Problem Relation Age of Onset   Hyperlipidemia Mother    Hypertension Mother    Diabetes Father     Social History Social History   Tobacco Use   Smoking status: Every Day    Packs/day: 0.50    Years: 40.00    Pack years: 20.00    Types: Cigarettes   Smokeless tobacco: Never  Vaping Use   Vaping Use: Every day  Substance Use Topics   Alcohol use: Yes    Comment: 1 glass of wine   Drug use: No     Allergies   Asa [aspirin], Coreg [carvedilol], and Hctz [hydrochlorothiazide]   Review of Systems Review of Systems  Skin:  Positive for rash.  All other systems reviewed and are negative.   Physical Exam Triage Vital Signs ED Triage Vitals  Enc Vitals Group     BP 07/03/21 1829 (!) 153/79     Pulse Rate 07/03/21 1829 88     Resp 07/03/21 1829 20     Temp 07/03/21 1829 98.5 F (36.9 C)     Temp Source 07/03/21 1829 Oral     SpO2 07/03/21 1829 95 %     Weight 07/03/21 1832 172 lb (78 kg)     Height 07/03/21 1832 5\' 1"  (1.549 m)     Head Circumference --      Peak Flow --      Pain Score 07/03/21 1832 5     Pain Loc --      Pain Edu? --      Excl. in GC? --    No data found.  Updated Vital Signs BP (!) 153/79 (BP Location: Right Arm)   Pulse 88   Temp 98.5 F (36.9 C) (Oral)   Resp 20   Ht 5\' 1"  (1.549 m)   Wt 172 lb (78 kg)   SpO2 95%   BMI 32.50 kg/m     Physical Exam Vitals and nursing note reviewed.  Constitutional:      General: She is not in acute distress.    Appearance: She is obese. She is not ill-appearing.     Comments: Disheveled appearance with poor hygiene, currently wearing pull-up briefs that are 2-3 sizes too small for her causing constriction of bilateral inguinal creases.  HENT:     Head: Normocephalic and atraumatic.     Mouth/Throat:     Mouth: Mucous membranes are moist.     Pharynx: Oropharynx is clear.  Eyes:     Extraocular Movements: Extraocular movements intact.     Pupils:  Pupils are equal, round, and reactive to light.  Cardiovascular:     Rate and Rhythm: Normal rate and regular rhythm.     Pulses: Normal pulses.     Heart sounds:  Normal heart sounds. No murmur heard. Pulmonary:     Effort: Pulmonary effort is normal. No respiratory distress.     Breath sounds: Normal breath sounds. No wheezing, rhonchi or rales.  Musculoskeletal:        General: Normal range of motion.     Comments: Left superior leg (medial aspect)/left inguinal crease: Erythematous, macerated, plaques, erosions, with peripheral scaling noted.  Skin:    General: Skin is warm and dry.  Neurological:     General: No focal deficit present.     Mental Status: She is alert and oriented to person, place, and time.  Psychiatric:        Mood and Affect: Mood normal.        Behavior: Behavior normal.     UC Treatments / Results  Labs (all labs ordered are listed, but only abnormal results are displayed) Labs Reviewed - No data to display  EKG   Radiology No results found.  Procedures Procedures (including critical care time)  Medications Ordered in UC Medications - No data to display  Initial Impression / Assessment and Plan / UC Course  I have reviewed the triage vital signs and the nursing notes.  Pertinent labs & imaging results that were available during my care of the patient were reviewed by me and considered in my medical decision making (see chart for details).    MDM: 1.  Cellulitis of left leg-Rx'd Augmentin, 2.  Candidal intertrigo-Rx'd nystatin cream, advised patient/daughter to keep affected area dry and clean at all times May use OTC Zeasorb to enhance dryness of affected area.  Patient discharged home, hemodynamically stable. Final Clinical Impressions(s) / UC Diagnoses   Final diagnoses:  Cellulitis of leg, left  Candidal intertrigo     Discharge Instructions      Advised patient/daughter to take medication with food as directed to completion.   Advised patient to wear proper size of depends preferably extra-large and to keep affected area dry and clean at all times.  Advised patient daughter may use OTC Zeesorb for increased dryness.     ED Prescriptions     Medication Sig Dispense Auth. Provider   amoxicillin-clavulanate (AUGMENTIN) 875-125 MG tablet Take 1 tablet by mouth every 12 (twelve) hours for 10 days. 20 tablet Trevor Iha, FNP   nystatin cream (MYCOSTATIN) Apply to affected area left inguinal crease 2 times daily x10 days 30 g Trevor Iha, FNP      PDMP not reviewed this encounter.   Trevor Iha, FNP 07/03/21 2116

## 2022-12-01 ENCOUNTER — Ambulatory Visit
Admission: EM | Admit: 2022-12-01 | Discharge: 2022-12-01 | Disposition: A | Discharge status: Home / Self Care | Payer: Medicaid Other | Attending: Family Medicine | Admitting: Family Medicine

## 2022-12-01 ENCOUNTER — Ambulatory Visit: Admit: 2022-12-01 | Discharge status: Absent without leave | Payer: Medicaid Other

## 2022-12-01 DIAGNOSIS — R300 Dysuria: Secondary | ICD-10-CM

## 2022-12-01 DIAGNOSIS — B9689 Other specified bacterial agents as the cause of diseases classified elsewhere: Secondary | ICD-10-CM | POA: Diagnosis not present

## 2022-12-01 DIAGNOSIS — N3090 Cystitis, unspecified without hematuria: Secondary | ICD-10-CM | POA: Insufficient documentation

## 2022-12-01 HISTORY — DX: Atherosclerotic heart disease of native coronary artery without angina pectoris: I25.10

## 2022-12-01 LAB — POCT URINALYSIS DIP (MANUAL ENTRY)
Bilirubin, UA: NEGATIVE
Glucose, UA: NEGATIVE mg/dL
Nitrite, UA: NEGATIVE
Spec Grav, UA: >=1.03 — AB (ref 1.010–1.025)
Urobilinogen, UA: 1 E.U./dL
pH, UA: 6 (ref 5.0–8.0)

## 2022-12-01 MED ORDER — CEPHALEXIN 500 MG PO CAPS: 500.0000 mg | ORAL_CAPSULE | Freq: Two times a day (BID) | ORAL | 0 refills | Status: DC

## 2022-12-01 NOTE — Discharge Instructions (Signed)
Take antibiotic 2 times a day for 5 days Drink lots of water  Your urine has been sent to the laboratory for culture.  You will be called if any change in antibiotic is needed

## 2022-12-01 NOTE — ED Triage Notes (Signed)
Pt here today with daughter who says she's been c/o dysuria since Sunday. More frequent but a little bit at a time. Some lower back pain.

## 2022-12-01 NOTE — ED Provider Notes (Signed)
[facility]    CSN: [id number] Arrival date & time: 12/01/22  1701      History   Chief Complaint Chief Complaint  Patient presents with   Dysuria    HPI Kristi Tanner is a 61 y.o. female.   HPI  Patient is here for dysuria.  Denies fever or chills.  Denies nausea or vomiting.  No flank pain or history of kidney problems.  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      Home Medications    Prior to Admission medications   Medication Sig Start Date End Date Taking? Authorizing Provider  amLODipine (NORVASC) 5 MG tablet Take 1 tablet by mouth daily. 07/14/22  Yes [provider]  cephALEXin (KEFLEX) 500 MG capsule Take 1 capsule (500 mg total) by mouth 2 (two) times daily. 12/01/22  Yes Eustace Moore, MD  DULoxetine (CYMBALTA) 60 MG capsule Take 60 mg by mouth daily. 06/22/22  Yes [provider]  traZODone (DESYREL) 100 MG tablet Take 200 mg by mouth at bedtime. 08/04/22  Yes [provider]  albuterol (PROVENTIL HFA;VENTOLIN HFA) 108 (90 BASE) MCG/ACT inhaler Inhale two puffs every 4-6 hours only as needed for shortness of breath or wheezing. 06/19/14 06/19/15  Laren Boom, DO   atorvastatin (LIPITOR) 40 MG tablet Take 1 tablet (40 mg total) by mouth daily. 02/24/16   Laren Boom, DO  cholecalciferol (VITAMIN D3) 25 MCG (1000 UNIT) tablet Take 1,000 Units by mouth daily.    [provider]  cloNIDine (CATAPRES - DOSED IN MG/24 HR) 0.1 mg/24hr patch Place 0.1 mg onto the skin once a week.    [provider]  divalproex (DEPAKOTE ER) 250 MG 24 hr tablet Take 250 mg by mouth daily.    [provider]  divalproex (DEPAKOTE) 125 MG DR tablet Take 125 mg by mouth 3 (three) times daily.    [provider]  escitalopram (LEXAPRO) 20 MG tablet Take 20 mg by mouth daily.    [provider]  gabapentin (NEURONTIN) 400 MG capsule Take 400 mg by mouth 3 (three) times daily.    [provider]  hydrOXYzine (ATARAX/VISTARIL) 25 MG tablet Take 1 tablet (25 mg total) by mouth 3 (three) times daily as needed for itching. 03/23/16   Laren Boom, DO  Krill Oil 300 MG CAPS Take 300 mg by mouth.    [provider]  montelukast (SINGULAIR) 10 MG tablet Take 1 tablet (10 mg total) by mouth at bedtime. 08/27/15   Laren Boom, DO  Multiple Vitamins-Calcium (ONE-A-DAY WOMENS FORMULA PO) Take by mouth.    [provider]  nystatin cream (MYCOSTATIN) Apply to  affected area left inguinal crease 2 times daily x10 days 07/03/21   Trevor Iha, FNP  QUEtiapine (SEROQUEL) 50 MG tablet Take 50 mg by mouth at bedtime.    [provider]    Family History Family History  Problem Relation Age of Onset   Hyperlipidemia Mother    Hypertension Mother    Diabetes Father     Social History Social History   Tobacco Use   Smoking status: Every Day    Packs/day: 0.50    Years: 40.00    Total pack years: 20.00    Types: Cigarettes   Smokeless tobacco: Never  Vaping Use   Vaping Use: Every day  Substance Use Topics   Alcohol use: Yes    Comment: 1 glass of wine   Drug use: No     Allergies   Asa [aspirin], Coreg  [carvedilol], and Hctz [hydrochlorothiazide]   Review of Systems Review of Systems See HPI  Physical Exam Triage Vital Signs ED Triage Vitals  Enc Vitals Group     BP 12/01/22 1742 130/68     Pulse Rate 12/01/22 1735 94     Resp 12/01/22 1735 18     Temp 12/01/22 1735 98.9 F (37.2 C)     Temp Source 12/01/22 1735 Oral     SpO2 12/01/22 1735 96 %     Weight --      Height --      Head Circumference --      Peak Flow --      Pain Score 12/01/22 1735 0     Pain Loc --      Pain Edu? --      Excl. in GC? --    No data found.  Updated Vital Signs BP 130/68 (BP Location: Left Arm)   Pulse 94   Temp 98.9 F (37.2 C) (Oral)   Resp 18   SpO2 96%       Physical Exam Constitutional:      General: Kristi Tanner is not in acute distress.    Appearance: Kristi Tanner is well-developed. Kristi Tanner is not ill-appearing.  HENT:     Head: Normocephalic and atraumatic.  Eyes:     Conjunctiva/sclera: Conjunctivae normal.     Pupils: Pupils are equal, round, and reactive to light.  Cardiovascular:     Rate and Rhythm: Normal rate.  Pulmonary:     Effort: Pulmonary effort is normal. No respiratory distress.  Abdominal:     General: There is no distension.     Palpations: Abdomen is soft.     Tenderness: There is no abdominal tenderness. There is no right CVA tenderness or left CVA tenderness.  Musculoskeletal:        General: Normal range of motion.     Cervical back: Normal range of motion.  Skin:    General: Skin is warm and dry.  Neurological:     Mental Status: Kristi Tanner is alert.      UC Treatments / Results  Labs (all labs ordered are listed, but only abnormal results are displayed) Labs Reviewed  POCT URINALYSIS DIP (MANUAL ENTRY) - Abnormal; Notable for the following components:      Result Value   Clarity, UA cloudy (*)    Ketones, POC UA small (15) (*)    Spec Grav, UA >=1.030 (*)    Blood, UA small (*)    Protein Ur, POC trace (*)    Leukocytes, UA Small (1+) (*)    All other  components  within normal limits  URINE CULTURE    EKG   Radiology No results found.  Procedures Procedures (including critical care time)  Medications Ordered in UC Medications - No data to display  Initial Impression / Assessment and Plan / UC Course  I have reviewed the triage vital signs and the nursing notes.  Pertinent labs & imaging results that were available during my care of the patient were reviewed by me and considered in my medical decision making (see chart for details).     Final Clinical Impressions(s) / UC Diagnoses   Final diagnoses:  Cystitis  Dysuria     Discharge Instructions      Take antibiotic 2 times a day for 5 days Drink lots of water  Your urine has been sent to the laboratory for culture.  You will be called if any change in antibiotic is needed   ED Prescriptions     Medication Sig Dispense Auth. Provider   cephALEXin (KEFLEX) 500 MG capsule Take 1 capsule (500 mg total) by mouth 2 (two) times daily. 10 capsule Eustace Moore, MD      PDMP not reviewed this encounter.   Eustace Moore, MD 12/01/22 [phone]

## 2022-12-02 ENCOUNTER — Telehealth: Payer: Self-pay

## 2022-12-02 NOTE — Telephone Encounter (Signed)
TC to f/u with pt after yesterday's visit to [facility]. Daughter-in-law reports pt is asleep and also pt's son who is her POA. Advised to call [facility] if any problems or concerns after yesterday's visit.

## 2022-12-04 LAB — URINE CULTURE: Culture: 20000 — AB

## 2023-03-05 ENCOUNTER — Ambulatory Visit: Admit: 2023-03-05 | Discharge status: Absent without leave | Payer: Medicaid Other

## 2023-03-23 ENCOUNTER — Ambulatory Visit: Payer: Medicaid Other

## 2023-03-30 ENCOUNTER — Ambulatory Visit
Admission: RE | Admit: 2023-03-30 | Discharge: 2023-03-30 | Disposition: A | Discharge status: Home / Self Care | Payer: Medicaid Other | Source: Ambulatory Visit | Attending: Family Medicine | Admitting: Family Medicine

## 2023-03-30 VITALS — BP 163/71 | HR 85 | Temp 98.7°F | Resp 18 | Ht 61.0 in | Wt 217.0 lb

## 2023-03-30 DIAGNOSIS — L03314 Cellulitis of groin: Secondary | ICD-10-CM | POA: Diagnosis not present

## 2023-03-30 DIAGNOSIS — B372 Candidiasis of skin and nail: Secondary | ICD-10-CM

## 2023-03-30 MED ORDER — AMOXICILLIN-POT CLAVULANATE 875-125 MG PO TABS: 1.0000 | ORAL_TABLET | Freq: Two times a day (BID) | ORAL | 0 refills | Status: AC

## 2023-03-30 MED ORDER — NYSTATIN 100000 UNIT/GM EX POWD: 1.0000 | Freq: Three times a day (TID) | CUTANEOUS | 0 refills | Status: AC

## 2023-03-30 NOTE — Discharge Instructions (Addendum)
Instructed daughter/patient to take medication as directed with food to completion.  Advised may use nystatin powder 3 times daily to affected area of bilateral inner thighs for the next 10 days.  Advised increase daily water intake to 64 ounces per day while taking this medication.  Stressed the maintenance of dryness of these affected areas to patient and daughter.  Advised if symptoms worsen and/or unresolved please follow-up with PCP or here for further evaluation.

## 2023-03-30 NOTE — ED Provider Notes (Signed)
[facility]    CSN: [id number] Arrival date & time: 03/30/23  1727      History   Chief Complaint Chief Complaint  Patient presents with   Groin Pain    Infection in groin - Entered by patient    HPI Kristi Tanner is a 62 y.o. female.   HPI Pleasant 89-YEAR-OLD FEMALE PRESENTS WITH BILATERAL RASH OF GROIN FOR 3 DAYS.  PATIENT IS ACCOMPANIED BY HER DAUGHTER and Grandson THIS EVENING.  PMH significant for CAD, HTN, and TBI.   Past Medical History:  Diagnosis Date   Coronary artery disease    Dyslipidemia    Hyperlipidemia    Hypertension    Korsakoff syndrome    Traumatic brain injury    Wernicke-Korsakoff syndrome (alcoholic)     Patient Active Problem List   Diagnosis Date Noted   Essential hypertension 06/11/2015   Osteoarthritis, hand 03/19/2014   Preventive measure 02/05/2014   Vision loss 02/05/2014   Hyperlipidemia 02/05/2014   Vasomotor flushing 02/05/2014   Fracture of radial head, right, closed 02/02/2014   Closed nondisplaced fracture of base of fifth metacarpal bone of right hand 02/02/2014   Cyst of finger 02/02/2014    Past Surgical History:  Procedure Laterality Date   (2)c-sections      OB History   No obstetric history on file.      Home Medications    Prior to Admission medications   Medication Sig Start Date End Date Taking? Authorizing Provider  amLODipine (NORVASC) 5 MG tablet Take 1 tablet by mouth daily. 07/14/22  Yes [provider]  amoxicillin-clavulanate (AUGMENTIN) 875-125 MG tablet Take 1 tablet by mouth 2 (two) times daily for 10 days. 03/30/23 04/09/23 Yes Trevor Iha, FNP  atorvastatin (LIPITOR) 40 MG tablet Take 1 tablet (40 mg total) by mouth daily. 02/24/16  Yes Hommel, Sean, DO  cholecalciferol (VITAMIN D3) 25 MCG (1000 UNIT) tablet Take 1,000 Units by mouth daily.   Yes [provider]  divalproex (DEPAKOTE ER) 250 MG 24 hr tablet Take 250 mg by mouth daily.   Yes [provider]   divalproex (DEPAKOTE) 125 MG DR tablet Take 125 mg by mouth 3 (three) times daily.   Yes [provider]  DULoxetine (CYMBALTA) 60 MG capsule Take 60 mg by mouth daily. 06/22/22  Yes [provider]  escitalopram (LEXAPRO) 20 MG tablet Take 20 mg by mouth daily.   Yes [provider]  gabapentin (NEURONTIN) 400 MG capsule Take 400 mg by mouth 3 (three) times daily.   Yes [provider]  Boris Lown Oil 300 MG CAPS Take 300 mg by mouth.   Yes [provider]  montelukast (SINGULAIR) 10 MG tablet Take 1 tablet (10 mg total) by mouth at bedtime. 08/27/15  Yes Hommel, Sean, DO  Multiple Vitamins-Calcium (ONE-A-DAY WOMENS FORMULA PO) Take by mouth.   Yes [provider]  nystatin (MYCOSTATIN/NYSTOP) powder Apply 1 Application topically 3 (three) times daily for 10 days. 03/30/23 04/09/23 Yes Trevor Iha, FNP  QUEtiapine (SEROQUEL) 50 MG tablet Take 50 mg by mouth at bedtime.   Yes [provider]  traZODone (DESYREL) 100 MG tablet Take 200 mg by mouth at bedtime. 08/04/22  Yes [provider]  albuterol (PROVENTIL HFA;VENTOLIN HFA) 108 (90 BASE) MCG/ACT inhaler Inhale two puffs every 4-6 hours only as needed for shortness of breath or wheezing. 06/19/14 06/19/15  Laren Boom, DO    Family History Family History  Problem Relation Age of Onset  Hyperlipidemia Mother    Hypertension Mother    Diabetes Father     Social History Social History   Tobacco Use   Smoking status: Every Day    Packs/day: 0.50    Years: 40.00    Additional pack years: 0.00    Total pack years: 20.00    Types: Cigarettes   Smokeless tobacco: Never  Vaping Use   Vaping Use: Every day  Substance Use Topics   Alcohol use: Yes    Comment: 1 glass of wine   Drug use: No     Allergies   Asa [aspirin], Coreg [carvedilol], and Hctz [hydrochlorothiazide]   Review of Systems Review of Systems  Skin:  Positive for rash.     Physical Exam Triage  Vital Signs ED Triage Vitals  Enc Vitals Group     BP      Pulse      Resp      Temp      Temp src      SpO2      Weight      Height      Head Circumference      Peak Flow      Pain Score      Pain Loc      Pain Edu?      Excl. in GC?    No data found.  Updated Vital Signs BP (!) 163/71 (BP Location: Left Arm)   Pulse 85   Temp 98.7 F (37.1 C) (Oral)   Resp 18   Ht  (1.549 m)   Wt 217 lb (98.4 kg)   SpO2 94%   BMI 41.00 kg/m   Physical Exam Vitals and nursing note reviewed.  Constitutional:      Appearance: Normal appearance. She is obese.  HENT:     Head: Normocephalic and atraumatic.     Mouth/Throat:     Mouth: Mucous membranes are moist.     Pharynx: Oropharynx is clear.  Eyes:     Extraocular Movements: Extraocular movements intact.     Conjunctiva/sclera: Conjunctivae normal.     Pupils: Pupils are equal, round, and reactive to light.  Cardiovascular:     Rate and Rhythm: Normal rate and regular rhythm.     Pulses: Normal pulses.     Heart sounds: Normal heart sounds.  Pulmonary:     Effort: Pulmonary effort is normal.     Breath sounds: Normal breath sounds. No wheezing, rhonchi or rales.  Musculoskeletal:        General: Normal range of motion.     Cervical back: Normal range of motion and neck supple.  Skin:    General: Skin is warm and dry.     Comments: Bilateral inguinal crease: Erythematous, macerated, plaques, erosions, including erythematous satellites with delicate peripheral scaling and papuluopustules, stressed to patient and daughter to maintain dryness  Neurological:     General: No focal deficit present.     Mental Status: She is alert and oriented to person, place, and time. Mental status is at baseline.      UC Treatments / Results  Labs (all labs ordered are listed, but only abnormal results are displayed) Labs Reviewed - No data to display  EKG   Radiology No results found.  Procedures Procedures (including  critical care time)  Medications Ordered in UC Medications - No data to display  Initial Impression / Assessment and Plan / UC Course  I have reviewed the triage vital signs  and the nursing notes.  Pertinent labs & imaging results that were available during my care of the patient were reviewed by me and considered in my medical decision making (see chart for details).     MDM: 1.  Cellulitis of groin-Rx'd Augmentin 875/125 mg twice daily x 10 days; 2.  Candidal intertrigo-Rx'd Nystatin powder 1 application topically 3 times daily for the next 10 days. Instructed daughter/patient to take medication as directed with food to completion.  Advised may use nystatin powder 3 times daily to affected area of bilateral inner thighs for the next 10 days.  Advised increase daily water intake to 64 ounces per day while taking this medication.  Stressed the maintenance of dryness of these affected areas to patient and daughter.  Advised if symptoms worsen and/or unresolved please follow-up with PCP or here for further evaluation.  Discharged home, hemodynamically stable. Final Clinical Impressions(s) / UC Diagnoses   Final diagnoses:  Cellulitis of groin  Candidal intertrigo     Discharge Instructions      Instructed daughter/patient to take medication as directed with food to completion.  Advised may use nystatin powder 3 times daily to affected area of bilateral inner thighs for the next 10 days.  Advised increase daily water intake to 64 ounces per day while taking this medication.  Stressed the maintenance of dryness of these affected areas to patient and daughter.  Advised if symptoms worsen and/or unresolved please follow-up with PCP or here for further evaluation.     ED Prescriptions     Medication Sig Dispense Auth. Provider   amoxicillin-clavulanate (AUGMENTIN) 875-125 MG tablet Take 1 tablet by mouth 2 (two) times daily for 10 days. 20 tablet Trevor Iha, FNP   nystatin  (MYCOSTATIN/NYSTOP) powder Apply 1 Application topically 3 (three) times daily for 10 days. 60 g Trevor Iha, FNP      PDMP not reviewed this encounter.   Trevor Iha, FNP 03/30/23 1830

## 2023-03-30 NOTE — ED Triage Notes (Signed)
Patient c/o sores in groin area on both sides x 3 days.  Patient has been washing with hibiclense on the area.
